# Patient Record
Sex: Male | Born: 1975 | Race: White | Hispanic: No | Smoking: Current every day smoker
Health system: Southern US, Community
[De-identification: ages and names within clinical notes are randomized; demographics above are authoritative.]

## PROBLEM LIST (undated history)

## (undated) HISTORY — PX: OTHER SURGICAL HISTORY: SHX169

---

## 2002-10-04 ENCOUNTER — Emergency Department (HOSPITAL_COMMUNITY): Admission: EM | Admit: 2002-10-04 | Discharge: 2002-10-04 | Payer: Self-pay | Admitting: Emergency Medicine

## 2005-05-27 ENCOUNTER — Emergency Department: Payer: Self-pay | Admitting: Emergency Medicine

## 2005-11-15 ENCOUNTER — Emergency Department: Payer: Self-pay | Admitting: Internal Medicine

## 2006-01-07 ENCOUNTER — Emergency Department: Payer: Self-pay | Admitting: Unknown Physician Specialty

## 2006-02-06 ENCOUNTER — Emergency Department: Payer: Self-pay | Admitting: Emergency Medicine

## 2006-03-04 ENCOUNTER — Emergency Department: Payer: Self-pay | Admitting: Internal Medicine

## 2006-04-05 ENCOUNTER — Emergency Department: Payer: Self-pay | Admitting: Emergency Medicine

## 2006-04-05 ENCOUNTER — Other Ambulatory Visit: Payer: Self-pay

## 2006-10-07 ENCOUNTER — Emergency Department: Payer: Self-pay | Admitting: Emergency Medicine

## 2006-12-25 ENCOUNTER — Emergency Department: Payer: Self-pay | Admitting: Emergency Medicine
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
## 2007-12-24 ENCOUNTER — Emergency Department: Payer: Self-pay | Admitting: Emergency Medicine

## 2008-10-01 ENCOUNTER — Emergency Department: Payer: Self-pay | Admitting: Emergency Medicine

## 2009-03-11 ENCOUNTER — Emergency Department: Payer: Self-pay | Admitting: Emergency Medicine

## 2009-04-21 ENCOUNTER — Emergency Department: Payer: Self-pay | Admitting: Emergency Medicine

## 2009-04-23 ENCOUNTER — Emergency Department: Payer: Self-pay | Admitting: Emergency Medicine

## 2010-08-23 ENCOUNTER — Emergency Department: Payer: Self-pay | Admitting: Emergency Medicine

## 2012-02-02 ENCOUNTER — Emergency Department: Payer: Self-pay | Admitting: *Deleted

## 2012-08-05 ENCOUNTER — Emergency Department: Payer: Self-pay | Admitting: Emergency Medicine

## 2012-11-09 ENCOUNTER — Emergency Department: Payer: Self-pay | Admitting: Internal Medicine

## 2013-01-11 ENCOUNTER — Emergency Department: Payer: Self-pay | Admitting: Emergency Medicine

## 2013-01-13 ENCOUNTER — Encounter (HOSPITAL_COMMUNITY): Payer: Self-pay | Admitting: *Deleted

## 2013-01-13 ENCOUNTER — Emergency Department (HOSPITAL_COMMUNITY): Payer: Self-pay

## 2013-01-13 ENCOUNTER — Emergency Department (HOSPITAL_COMMUNITY)
Admission: EM | Admit: 2013-01-13 | Discharge: 2013-01-13 | Disposition: A | Discharge status: Home / Self Care | Payer: Self-pay | Attending: Emergency Medicine | Admitting: Emergency Medicine

## 2013-01-13 DIAGNOSIS — S61239A Puncture wound without foreign body of unspecified finger without damage to nail, initial encounter: Secondary | ICD-10-CM

## 2013-01-13 DIAGNOSIS — W298XXA Contact with other powered powered hand tools and household machinery, initial encounter: Secondary | ICD-10-CM | POA: Insufficient documentation

## 2013-01-13 DIAGNOSIS — F17200 Nicotine dependence, unspecified, uncomplicated: Secondary | ICD-10-CM | POA: Insufficient documentation

## 2013-01-13 DIAGNOSIS — R209 Unspecified disturbances of skin sensation: Secondary | ICD-10-CM | POA: Insufficient documentation

## 2013-01-13 DIAGNOSIS — Y9389 Activity, other specified: Secondary | ICD-10-CM | POA: Insufficient documentation

## 2013-01-13 DIAGNOSIS — S61209A Unspecified open wound of unspecified finger without damage to nail, initial encounter: Secondary | ICD-10-CM | POA: Insufficient documentation

## 2013-01-13 DIAGNOSIS — Y929 Unspecified place or not applicable: Secondary | ICD-10-CM | POA: Insufficient documentation

## 2013-01-13 MED ORDER — OXYCODONE-ACETAMINOPHEN 5-325 MG PO TABS
1.0000 | ORAL_TABLET | Freq: Once | ORAL | Status: AC
  Administered 2013-01-13: 1 via ORAL
  Filled 2013-01-13: qty 1

## 2013-01-13 MED ORDER — HYDROCODONE-ACETAMINOPHEN 5-325 MG PO TABS: 1.0000 | ORAL_TABLET | Freq: Four times a day (QID) | ORAL | Status: DC | PRN

## 2013-01-13 MED ORDER — CEPHALEXIN 500 MG PO CAPS: 500.0000 mg | ORAL_CAPSULE | Freq: Two times a day (BID) | ORAL | Status: DC

## 2013-01-13 MED ORDER — CEPHALEXIN 250 MG PO CAPS
500.0000 mg | ORAL_CAPSULE | Freq: Once | ORAL | Status: AC
  Administered 2013-01-13: 500 mg via ORAL
  Filled 2013-01-13: qty 2

## 2013-01-13 NOTE — ED Provider Notes (Signed)
History    This chart was scribed for Justin Hicks, non-physician practitioner working with Flint Melter, MD by Leone Payor, ED Scribe. This patient was seen in room TR07C/TR07C and the patient's care was started at 1356.   CSN: [id number]  Arrival date & time 01/13/13  1356   None     Chief Complaint  Patient presents with  . Finger Injury     The history is provided by the patient. No language interpreter was used.    HPI Comments: Justin Hicks is a 37 y.o. male who presents to the Emergency Department complaining of a finger injury to the L pinkie finger that occurred yesterday. He reports accidentally drilled a drill bit through his finger. Pt states his tetanus is UTD. He has cleaned the affected area with peroxide. He denies any piece of the drill bit stuck inside the wound. He had associated numbness yesterday but is no longer numb today. States the bleeding is controlled today. Pt is a current everyday smoker and occasional alcohol user.   History reviewed. No pertinent past medical history.  Past Surgical History  Procedure Laterality Date  . Right leg surgery      No family history on file.  History  Substance Use Topics  . Smoking status: Current Every Day Smoker  . Smokeless tobacco: Not on file  . Alcohol Use: Yes     Comment: occ      Review of Systems  Constitutional: Negative for fever.  Skin: Positive for wound.  Neurological: Positive for numbness.    Allergies  Review of patient's allergies indicates no known allergies.  Home Medications  No current outpatient prescriptions on file.  BP 140/77  Pulse 80  Temp(Src) 98.4 F (36.9 C) (Oral)  Resp 16  SpO2 99%  Physical Exam  Nursing note and vitals reviewed. Constitutional: He is oriented to person, place, and time. He appears well-developed and well-nourished. No distress.  HENT:  Head: Normocephalic and atraumatic.  Eyes: EOM are normal.  Neck: Neck supple. No tracheal  deviation present.  Cardiovascular: Normal rate.   Pulmonary/Chest: Effort normal. No respiratory distress.  Musculoskeletal: Normal range of motion.  Through and through puncture wound to the distal left fifth finger. Does not involve the nail. Surrounding swelling and tenderness. Good ROM at both PIP DIP joint. Good strength with extension and flexion of the finger.   Neurological: He is alert and oriented to person, place, and time.  Skin: Skin is warm and dry.  Psychiatric: He has a normal mood and affect. His behavior is normal.    ED Course  Procedures (including critical care time)  DIAGNOSTIC STUDIES: Oxygen Saturation is 99% on room air, normal by my interpretation.    COORDINATION OF CARE: 3:22 PM Discussed treatment plan with pt at bedside and pt agreed to plan.   Labs Reviewed - No data to display Dg Hand Complete Left  01/13/2013  *RADIOLOGY REPORT*  Clinical Data: Traumatic injury to the distal fifth finger  LEFT HAND - COMPLETE 3+ VIEW  Comparison: None.  Findings: Soft tissue irregularity is noted consistent with the patient's given clinical history.  No acute fracture is noted.  No definitive bony injury is seen.  IMPRESSION: Soft tissue injury without definitive bony abnormality.   Original Report Authenticated By: Alcide Clever, M.D.      1. Puncture wound of finger of left hand, initial encounter       MDM  PT with through and through puncture wound to  the left distal phalanx of the 5th finger. No bony involvement. No nerve, vscular, tendon injury based on todays' exam. Tetanus up to date. Soaked in iodine and saline. Splint for protection. Ice elevation at  Home, NSAIDs, pain medication, keflex for infection prophylaxis. Follow up with hand.     I personally performed the services described in this documentation, which was scribed in my presence. The recorded information has been reviewed and is accurate.    Lottie Mussel, PA-C 01/13/13 1550

## 2013-01-13 NOTE — ED Notes (Signed)
Yesterday patient drilled a drill bit through his left pinkie.

## 2013-01-13 NOTE — Progress Notes (Signed)
Orthopedic Tech Progress Note Patient Details:  Justin Hicks [birth year] [id number]  Ortho Devices Type of Ortho Device: Finger splint Ortho Device/Splint Location: LUE Ortho Device/Splint Interventions: Ordered;Application   Jennye Moccasin 01/13/2013, 3:40 PM

## 2013-01-14 NOTE — ED Provider Notes (Signed)
Medical screening examination/treatment/procedure(s) were performed by non-physician practitioner and as supervising physician I was immediately available for consultation/collaboration.  Winnell Bento L Neo Yepiz, MD 01/14/13 0206 

## 2013-02-04 ENCOUNTER — Emergency Department (HOSPITAL_COMMUNITY)
Admission: EM | Admit: 2013-02-04 | Discharge: 2013-02-04 | Disposition: A | Discharge status: Home / Self Care | Payer: Self-pay | Attending: Emergency Medicine | Admitting: Emergency Medicine

## 2013-02-04 ENCOUNTER — Encounter (HOSPITAL_COMMUNITY): Payer: Self-pay

## 2013-02-04 DIAGNOSIS — M79609 Pain in unspecified limb: Secondary | ICD-10-CM | POA: Insufficient documentation

## 2013-02-04 DIAGNOSIS — M545 Low back pain, unspecified: Secondary | ICD-10-CM | POA: Insufficient documentation

## 2013-02-04 DIAGNOSIS — F17200 Nicotine dependence, unspecified, uncomplicated: Secondary | ICD-10-CM | POA: Insufficient documentation

## 2013-02-04 DIAGNOSIS — M542 Cervicalgia: Secondary | ICD-10-CM | POA: Insufficient documentation

## 2013-02-04 DIAGNOSIS — M549 Dorsalgia, unspecified: Secondary | ICD-10-CM

## 2013-02-04 MED ORDER — CYCLOBENZAPRINE HCL 10 MG PO TABS: 10.0000 mg | ORAL_TABLET | Freq: Two times a day (BID) | ORAL | Status: DC | PRN

## 2013-02-04 MED ORDER — OXYCODONE-ACETAMINOPHEN 5-325 MG PO TABS: 1.0000 | ORAL_TABLET | ORAL | Status: DC | PRN

## 2013-02-04 MED ORDER — CYCLOBENZAPRINE HCL 10 MG PO TABS
10.0000 mg | ORAL_TABLET | Freq: Once | ORAL | Status: AC
  Administered 2013-02-04: 10 mg via ORAL
  Filled 2013-02-04: qty 1

## 2013-02-04 MED ORDER — OXYCODONE-ACETAMINOPHEN 5-325 MG PO TABS
1.0000 | ORAL_TABLET | Freq: Once | ORAL | Status: AC
  Administered 2013-02-04: 1 via ORAL
  Filled 2013-02-04: qty 1

## 2013-02-04 NOTE — ED Notes (Signed)
Pt c/olower back pain x2 weeks, reports moving some furniture yesterday causing increase pain to the lower back and mid back

## 2013-02-04 NOTE — ED Provider Notes (Signed)
Medical screening examination/treatment/procedure(s) were performed by non-physician practitioner and as supervising physician I was immediately available for consultation/collaboration.  Elmus Mathes T Jarnell Cordaro, MD 02/04/13 2323 

## 2013-02-04 NOTE — Discharge Instructions (Signed)
Back Pain, Adult Low back pain is very common. About 1 in 5 people have back pain.The cause of low back pain is rarely dangerous. The pain often gets better over time.About half of people with a sudden onset of back pain feel better in just 2 weeks. About 8 in 10 people feel better by 6 weeks.  CAUSES Some common causes of back pain include:  Strain of the muscles or ligaments supporting the spine.  Wear and tear (degeneration) of the spinal discs.  Arthritis.  Direct injury to the back. DIAGNOSIS Most of the time, the direct cause of low back pain is not known.However, back pain can be treated effectively even when the exact cause of the pain is unknown.Answering your caregiver's questions about your overall health and symptoms is one of the most accurate ways to make sure the cause of your pain is not dangerous. If your caregiver needs more information, he or she may order lab work or imaging tests (X-rays or MRIs).However, even if imaging tests show changes in your back, this usually does not require surgery. HOME CARE INSTRUCTIONS For many people, back pain returns.Since low back pain is rarely dangerous, it is often a condition that people can learn to [facility] their own.   Remain active. It is stressful on the back to sit or stand in one place. Do not sit, drive, or stand in one place for more than 30 minutes at a time. Take short walks on level surfaces as soon as pain allows.Try to increase the length of time you walk each day.  Do not stay in bed.Resting more than 1 or 2 days can delay your recovery.  Do not avoid exercise or work.Your body is made to move.It is not dangerous to be active, even though your back may hurt.Your back will likely heal faster if you return to being active before your pain is gone.  Pay attention to your body when you bend and lift. Many people have less discomfortwhen lifting if they bend their knees, keep the load close to their bodies,and  avoid twisting. Often, the most comfortable positions are those that put less stress on your recovering back.  Find a comfortable position to sleep. Use a firm mattress and lie on your side with your knees slightly bent. If you lie on your back, put a pillow under your knees.  Only take over-the-counter or prescription medicines as directed by your caregiver. Over-the-counter medicines to reduce pain and inflammation are often the most helpful.Your caregiver may prescribe muscle relaxant drugs.These medicines help dull your pain so you can more quickly return to your normal activities and healthy exercise.  Put ice on the injured area.  Put ice in a plastic bag.  Place a towel between your skin and the bag.  Leave the ice on for 15-20 minutes, 3-4 times a day for the first 2 to 3 days. After that, ice and heat may be alternated to reduce pain and spasms.  Ask your caregiver about trying back exercises and gentle massage. This may be of some benefit.  Avoid feeling anxious or stressed.Stress increases muscle tension and can worsen back pain.It is important to recognize when you are anxious or stressed and learn ways to manage it.Exercise is a great option. SEEK MEDICAL CARE IF:  You have pain that is not relieved with rest or medicine.  You have pain that does not improve in 1 week.  You have new symptoms.  You are generally not feeling well. SEEK  IMMEDIATE MEDICAL CARE IF:   You have pain that radiates from your back into your legs.  You develop new bowel or bladder control problems.  You have unusual weakness or numbness in your arms or legs.  You develop nausea or vomiting.  You develop abdominal pain.  You feel faint. Document Released: 08/27/2005 Document Revised: 02/26/2012 Document Reviewed: 01/15/2011 [facility] Patient Information 2014 [place], [place].   Cryotherapy Cryotherapy means treatment with cold. Ice or gel packs can be used to reduce both pain and  swelling. Ice is the most helpful within the first 24 to 48 hours after an injury or flareup from overusing a muscle or joint. Sprains, strains, spasms, burning pain, shooting pain, and aches can all be eased with ice. Ice can also be used when recovering from surgery. Ice is effective, has very few side effects, and is safe for most people to use. PRECAUTIONS  Ice is not a safe treatment option for people with:  Raynaud's phenomenon. This is a condition affecting small blood vessels in the extremities. Exposure to cold may cause your problems to return.  Cold hypersensitivity. There are many forms of cold hypersensitivity, including:  Cold urticaria. Red, itchy hives appear on the skin when the tissues begin to warm after being iced.  Cold erythema. This is a red, itchy rash caused by exposure to cold.  Cold hemoglobinuria. Red blood cells break down when the tissues begin to warm after being iced. The hemoglobin that carry oxygen are passed into the urine because they cannot combine with blood proteins fast enough.  Numbness or altered sensitivity in the area being iced. If you have any of the following conditions, do not use ice until you have discussed cryotherapy with your caregiver:  Heart conditions, such as arrhythmia, angina, or chronic heart disease.  High blood pressure.  Healing wounds or open skin in the area being iced.  Current infections.  Rheumatoid arthritis.  Poor circulation.  Diabetes. Ice slows the blood flow in the region it is applied. This is beneficial when trying to stop inflamed tissues from spreading irritating chemicals to surrounding tissues. However, if you expose your skin to cold temperatures for too long or without the proper protection, you can damage your skin or nerves. Watch for signs of skin damage due to cold. HOME CARE INSTRUCTIONS Follow these tips to use ice and cold packs safely.  Place a dry or damp towel between the ice and skin. A damp  towel will cool the skin more quickly, so you may need to shorten the time that the ice is used.  For a more rapid response, add gentle compression to the ice.  Ice for no more than 10 to 20 minutes at a time. The bonier the area you are icing, the less time it will take to get the benefits of ice.  Check your skin after 5 minutes to make sure there are no signs of a poor response to cold or skin damage.  Rest 20 minutes or more in between uses.  Once your skin is numb, you can end your treatment. You can test numbness by very lightly touching your skin. The touch should be so light that you do not see the skin dimple from the pressure of your fingertip. When using ice, most people will feel these normal sensations in this order: cold, burning, aching, and numbness.  Do not use ice on someone who cannot communicate their responses to pain, such as small children or people with dementia.  HOW TO MAKE AN ICE PACK Ice packs are the most common way to use ice therapy. Other methods include ice massage, ice baths, and cryo-sprays. Muscle creams that cause a cold, tingly feeling do not offer the same benefits that ice offers and should not be used as a substitute unless recommended by your caregiver. To make an ice pack, do one of the following:  Place crushed ice or a bag of frozen vegetables in a sealable plastic bag. Squeeze out the excess air. Place this bag inside another plastic bag. Slide the bag into a pillowcase or place a damp towel between your skin and the bag.  Mix 3 parts water with 1 part rubbing alcohol. Freeze the mixture in a sealable plastic bag. When you remove the mixture from the freezer, it will be slushy. Squeeze out the excess air. Place this bag inside another plastic bag. Slide the bag into a pillowcase or place a damp towel between your skin and the bag. SEEK MEDICAL CARE IF:  You develop white spots on your skin. This may give the skin a blotchy (mottled)  appearance.  Your skin turns blue or pale.  Your skin becomes waxy or hard.  Your swelling gets worse. MAKE SURE YOU:   Understand these instructions.  Will watch your condition.  Will get help right away if you are not doing well or get worse. Document Released: 04/23/2011 Document Revised: 11/19/2011 Document Reviewed: 04/23/2011 [facility] Patient Information 2014 [place], [place].

## 2013-02-04 NOTE — ED Provider Notes (Signed)
History  This chart was scribed for Toy Baker, MD by Ardelia Mems, ED Scribe. This patient was seen in room TR05C/TR05C and the patient's care was started at 6:39 PM.   CSN: [id number]  Arrival date & time 02/04/13  1715     Chief Complaint  Patient presents with  . Back Pain    The history is provided by the patient. No language interpreter was used.    HPI Comments: Justin Hicks is a 37 y.o. male with a history of right leg surgery who presents to the Emergency Department complaining of moderate, intermittent lower back pain onset 2 weeks ago. Pt states that he was moving an entertainment center yesterday when his pain suddenly worsened. Pt states that pain radiates from lower back to center back, neck and legs. Pt denies head injury, abdominal pain, urinary symptoms, fever or any other symptoms. Pt is an alcohol user and a current every day smoker.   History reviewed. No pertinent past medical history.  Past Surgical History  Procedure Laterality Date  . Right leg surgery      History reviewed. No pertinent family history.  History  Substance Use Topics  . Smoking status: Current Every Day Smoker  . Smokeless tobacco: Not on file  . Alcohol Use: Yes     Comment: occ      Review of Systems  Gastrointestinal: Negative for abdominal pain.  Genitourinary: Negative for dysuria.  Musculoskeletal: Positive for back pain.    Allergies  Review of patient's allergies indicates no known allergies.  Home Medications   Current Outpatient Rx  Name  Route  Sig  Dispense  Refill  . HYDROcodone-acetaminophen (NORCO) 5-325 MG per tablet   Oral   Take 1 tablet by mouth every 6 (six) hours as needed for pain.   20 tablet   0     Triage Vitals: BP 140/70  Pulse 81  Temp(Src) 98.2 F (36.8 C) (Oral)  Resp 16  SpO2 98%  Physical Exam  Nursing note and vitals reviewed. Constitutional: He is oriented to person, place, and time. He appears well-developed and  well-nourished.  HENT:  Head: Normocephalic and atraumatic.  Eyes: EOM are normal. Pupils are equal, round, and reactive to light.  Neck: Normal range of motion. No tracheal deviation present.  Paracervical tenderness bilaterally with no swelling.  Pulmonary/Chest: Effort normal. No respiratory distress.  Abdominal: Soft. There is no tenderness.  Musculoskeletal:  Midline lower thoracic tenderness with minimal swelling.  Neurological: He is alert and oriented to person, place, and time.  Skin: Skin is warm. No rash noted.    ED Course  Procedures (including critical care time)  DIAGNOSTIC STUDIES: Oxygen Saturation is 98% on RA, normal by my interpretation.    COORDINATION OF CARE: 6:57 PM- Pt advised of plan for treatment and pt agrees.     Labs Reviewed - No data to display No results found.   No diagnosis found.  1. MUSCULAR BACK PAIN   MDM  Uncomplicated muscular back pain.         I personally performed the services described in this documentation, which was scribed in my presence. The recorded information has been reviewed and is accurate.     Arnoldo Hooker, PA-C 02/04/13 1915

## 2013-06-26 ENCOUNTER — Emergency Department (INDEPENDENT_AMBULATORY_CARE_PROVIDER_SITE_OTHER)
Admission: EM | Admit: 2013-06-26 | Discharge: 2013-06-26 | Disposition: A | Discharge status: Home / Self Care | Payer: Self-pay | Source: Home / Self Care | Attending: Family Medicine | Admitting: Family Medicine

## 2013-06-26 ENCOUNTER — Encounter (HOSPITAL_COMMUNITY): Payer: Self-pay | Admitting: Emergency Medicine

## 2013-06-26 DIAGNOSIS — K089 Disorder of teeth and supporting structures, unspecified: Secondary | ICD-10-CM

## 2013-06-26 DIAGNOSIS — K0889 Other specified disorders of teeth and supporting structures: Secondary | ICD-10-CM

## 2013-06-26 MED ORDER — AMOXICILLIN 500 MG PO CAPS: 500.0000 mg | ORAL_CAPSULE | Freq: Three times a day (TID) | ORAL | Status: DC

## 2013-06-26 MED ORDER — HYDROCODONE-ACETAMINOPHEN 5-325 MG PO TABS: 1.0000 | ORAL_TABLET | Freq: Four times a day (QID) | ORAL | Status: DC | PRN

## 2013-06-26 NOTE — ED Provider Notes (Signed)
Justin Hicks. is a 37 y.o. male who presents to Urgent Care today for left lower were tooth pain starting 3 days ago worsening. Pain is severe and interfering with sleep and eating. He is using the CPAP at her which is only helped a little. No fevers or chills nausea vomiting or diarrhea. He has an appointment with his dentist in 2 weeks.   History reviewed. No pertinent past medical history. History  Substance Use Topics  . Smoking status: Current Every Day Smoker  . Smokeless tobacco: Not on file  . Alcohol Use: Yes     Comment: occ   ROS as above Medications reviewed. No current facility-administered medications for this encounter.   Current Outpatient Prescriptions  Medication Sig Dispense Refill  . amoxicillin (AMOXIL) 500 MG capsule Take 1 capsule (500 mg total) by mouth 3 (three) times daily.  40 capsule  0  . HYDROcodone-acetaminophen (NORCO/VICODIN) 5-325 MG per tablet Take 1 tablet by mouth every 6 (six) hours as needed for pain.  20 tablet  0    Exam:  BP 130/84  Pulse 76  Temp(Src) 98.5 F (36.9 C) (Oral)  Resp 18  SpO2 100% Gen: Well NAD HEENT: EOMI,  MMM, broken degenerative left lower molar with exposed pulp. No surrounding gumline erythema. Tender to touch Lungs: CTABL Nl WOB Heart: RRR no MRG   Left tooth injecton Consent obtained Numbing topical paste applied to the base of the rear most left lower molar. 1 mL of Marcaine and epinephrine injection injected into the base of the tooth.  Patient had significant improvement in pain following the injection.  Assessment and Plan: 37 y.o. male with tooth pain status post injection.  Plan to treat with amoxicillin and Norco for pain control Referred to for trouble dentures Discussed warning signs or symptoms. Please see discharge instructions. Patient expresses understanding.      Rodolph Bong, MD 06/26/13 [phone]

## 2013-06-26 NOTE — ED Notes (Signed)
C/o tooth pain; has an appointment to see dental provider in [facility] in 2 weeks; cannot sleep due to pain

## 2013-06-29 ENCOUNTER — Encounter (HOSPITAL_COMMUNITY): Payer: Self-pay | Admitting: Emergency Medicine

## 2013-06-29 ENCOUNTER — Emergency Department (INDEPENDENT_AMBULATORY_CARE_PROVIDER_SITE_OTHER)
Admission: EM | Admit: 2013-06-29 | Discharge: 2013-06-29 | Disposition: A | Discharge status: Home / Self Care | Payer: Self-pay | Source: Home / Self Care

## 2013-06-29 DIAGNOSIS — K029 Dental caries, unspecified: Secondary | ICD-10-CM

## 2013-06-29 MED ORDER — KETOROLAC TROMETHAMINE 10 MG PO TABS: 10.0000 mg | ORAL_TABLET | Freq: Four times a day (QID) | ORAL | Status: DC | PRN

## 2013-06-29 NOTE — Discharge Instructions (Signed)
Take medicine as prescribed, see your dentist as soon as possible °

## 2013-06-29 NOTE — ED Provider Notes (Signed)
CSN: [id number]     Arrival date & time 06/29/13  1516 History   None    Chief Complaint  Patient presents with  . Dental Pain   (Consider location/radiation/quality/duration/timing/severity/associated sxs/prior Treatment) Patient is a 37 y.o. male presenting with tooth pain. The history is provided by the patient.  Dental Pain Location:  Lower Lower teeth location:  17/LL 3rd molar Quality:  Throbbing Severity:  Moderate Onset quality:  Gradual Duration:  3 days Timing:  Constant Progression:  Worsening Chronicity:  New Context: abscess, dental caries and poor dentition   Associated symptoms: no facial pain, no facial swelling and no fever     History reviewed. No pertinent past medical history. Past Surgical History  Procedure Laterality Date  . Right leg surgery     History reviewed. No pertinent family history. History  Substance Use Topics  . Smoking status: Current Every Day Smoker  . Smokeless tobacco: Not on file  . Alcohol Use: Yes     Comment: occ    Review of Systems  Constitutional: Negative.  Negative for fever.  HENT: Positive for dental problem. Negative for facial swelling.     Allergies  Review of patient's allergies indicates no known allergies.  Home Medications   Current Outpatient Rx  Name  Route  Sig  Dispense  Refill  . amoxicillin (AMOXIL) 500 MG capsule   Oral   Take 1 capsule (500 mg total) by mouth 3 (three) times daily.   40 capsule   0   . HYDROcodone-acetaminophen (NORCO/VICODIN) 5-325 MG per tablet   Oral   Take 1 tablet by mouth every 6 (six) hours as needed for pain.   20 tablet   0   . ketorolac (TORADOL) 10 MG tablet   Oral   Take 1 tablet (10 mg total) by mouth every 6 (six) hours as needed for pain.   20 tablet   0    BP 124/79  Pulse 83  Temp(Src) 98.1 F (36.7 C) (Oral)  Resp 16  SpO2 99% Physical Exam  Nursing note and vitals reviewed. Constitutional: He appears well-developed and well-nourished. No  distress.  HENT:  Head: Normocephalic.  Right Ear: External ear normal.  Left Ear: External ear normal.  Mouth/Throat: Oropharynx is clear and moist. Dental abscesses and dental caries present.      ED Course  Procedures (including critical care time) Labs Review Labs Reviewed - No data to display Imaging Review No results found.    MDM      Linna Hoff, MD 06/29/13 1739
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
## 2013-06-29 NOTE — ED Notes (Signed)
States he has an appt to see dental provider in [place] in 2 weeks

## 2014-01-27 ENCOUNTER — Emergency Department: Payer: Self-pay | Admitting: Emergency Medicine

## 2014-04-20 IMAGING — CR DG HAND COMPLETE 3+V*L*
3 series · 3 of 3 positions shown · non-contrast
Comparison: None.

CLINICAL DATA: Traumatic injury to the distal fifth finger

LEFT HAND - COMPLETE 3+ VIEW

[x hand pa left]
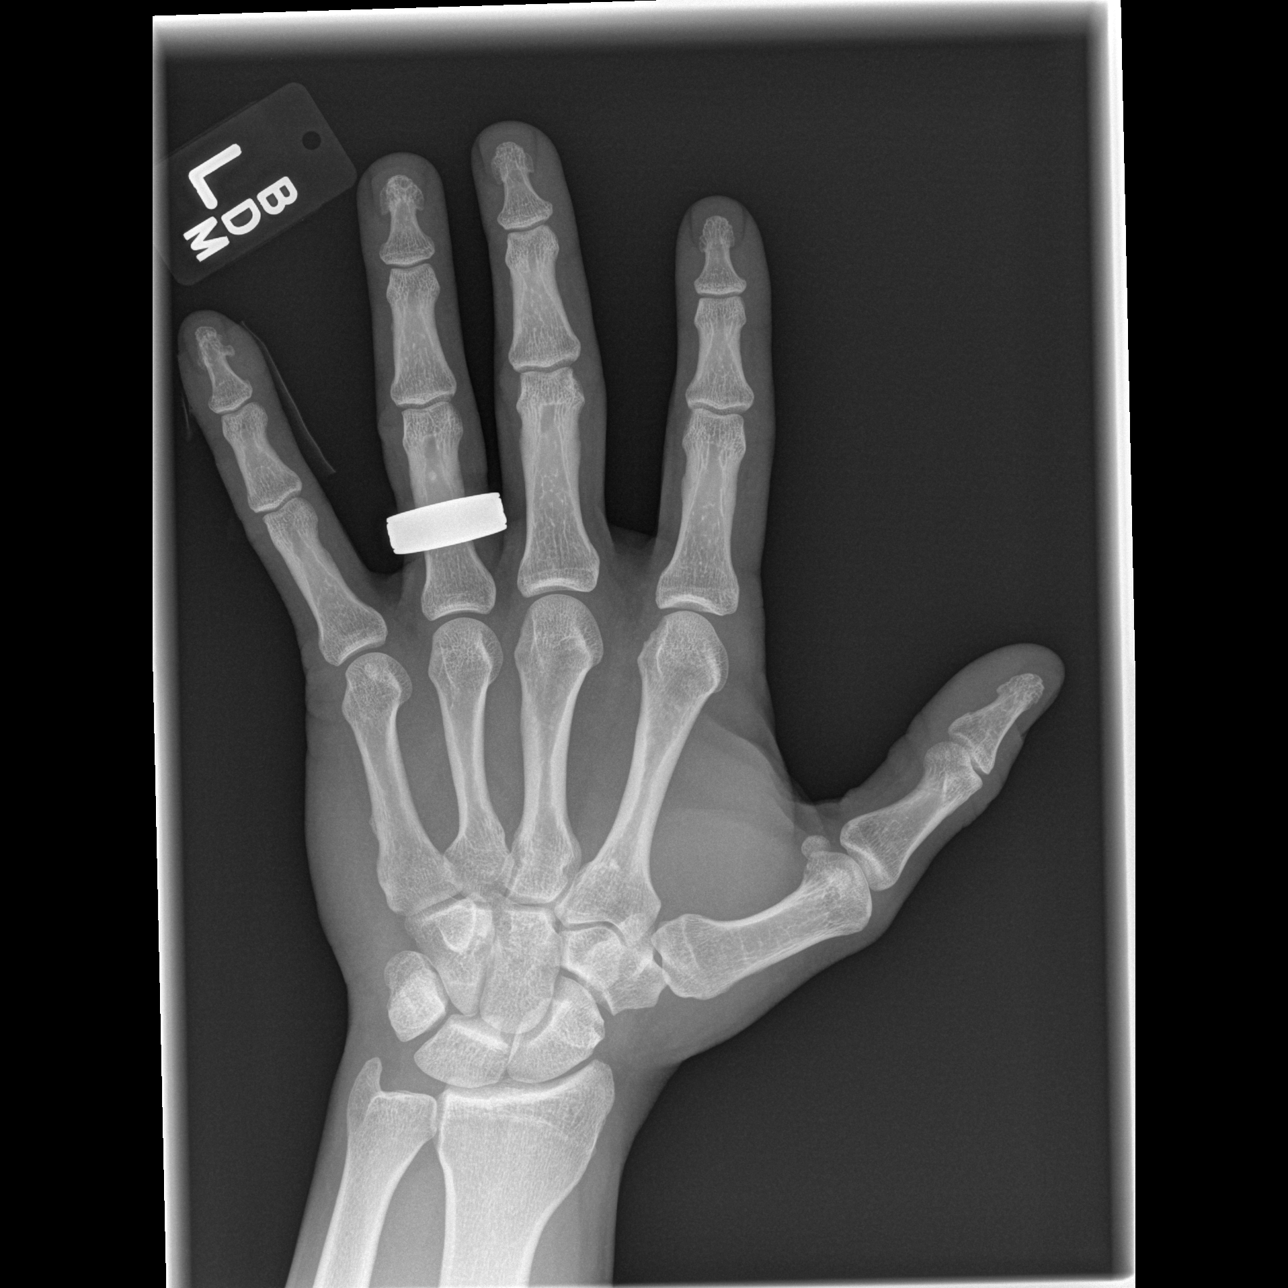

[x hand oblique left]
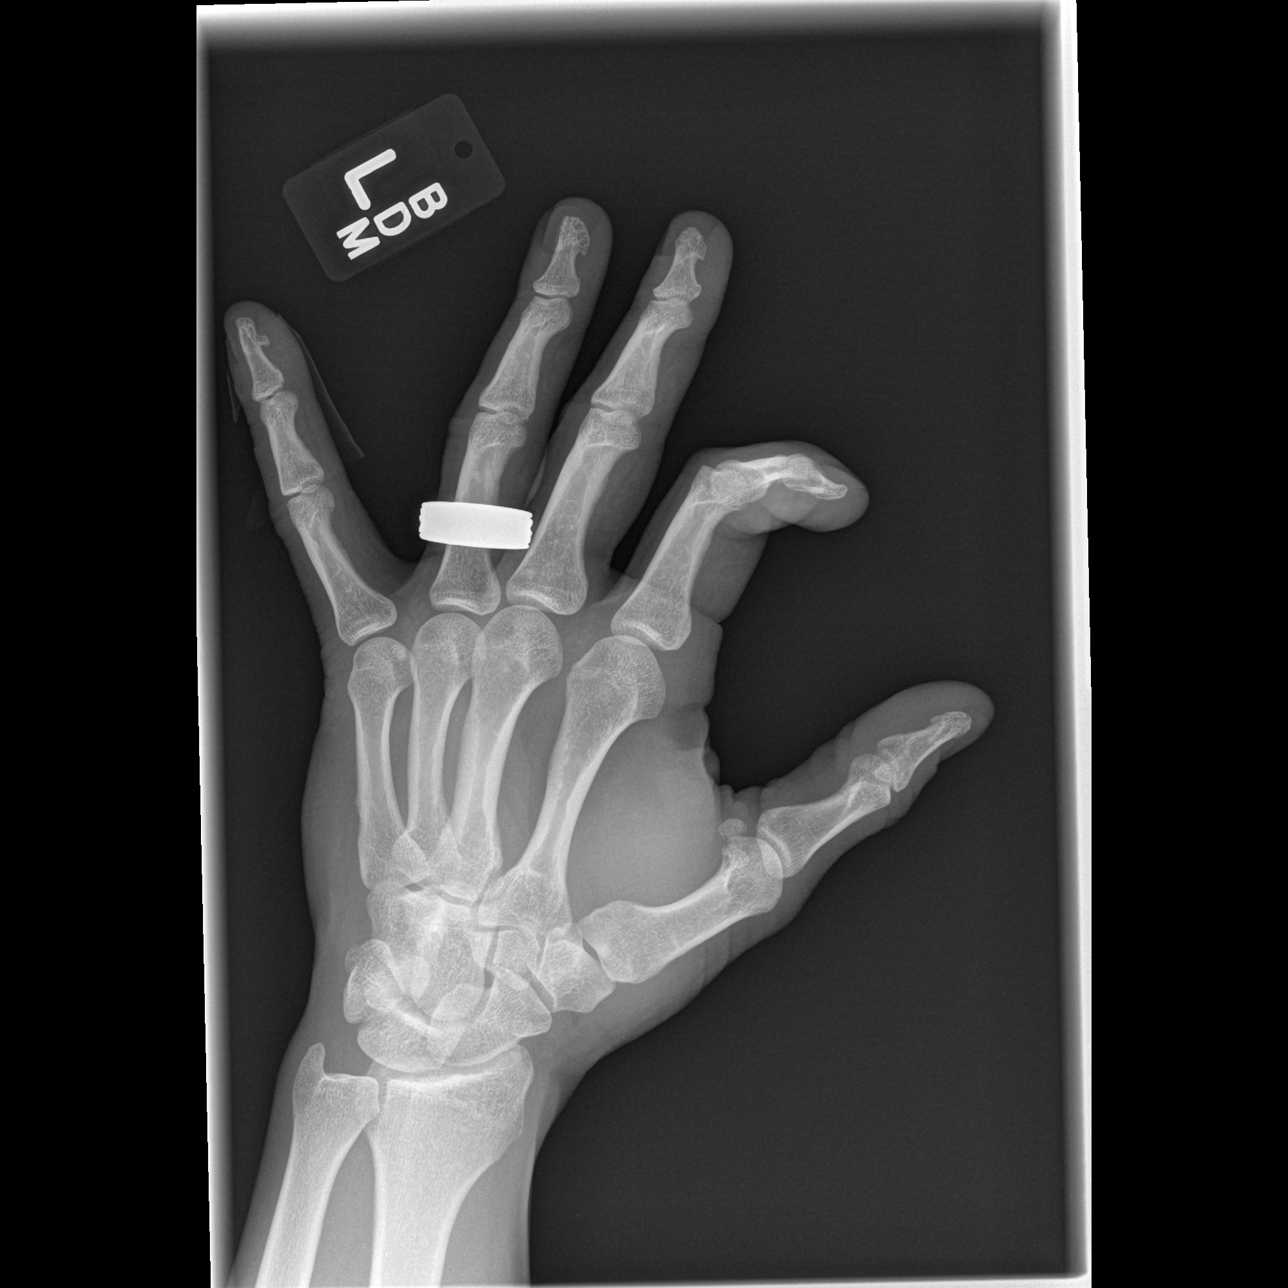

[x hand lat left]
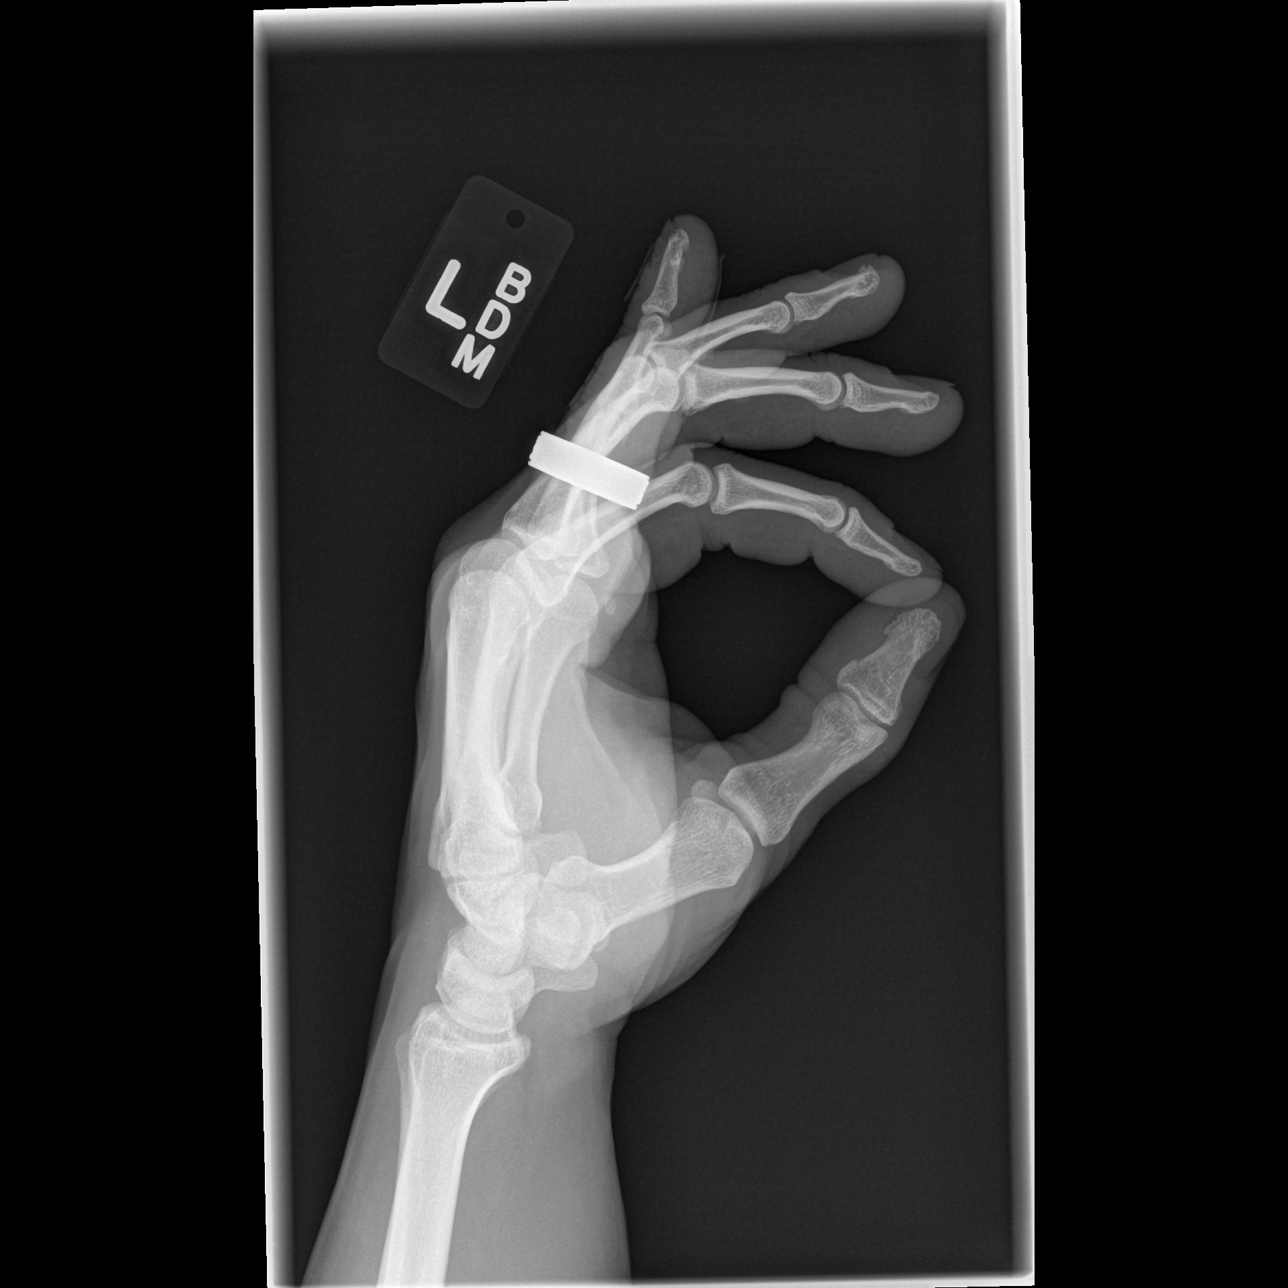

[3 of 3 positions shown; findings below may reference images not displayed]

FINDINGS: Soft tissue irregularity is noted consistent with the
patient's given clinical history.  No acute fracture is noted.  No
definitive bony injury is seen.
IMPRESSION: Soft tissue injury without definitive bony abnormality.

## 2014-07-09 ENCOUNTER — Encounter (HOSPITAL_COMMUNITY): Payer: Self-pay | Admitting: Emergency Medicine

## 2014-07-09 ENCOUNTER — Emergency Department (HOSPITAL_COMMUNITY)
Admission: EM | Admit: 2014-07-09 | Discharge: 2014-07-09 | Disposition: A | Discharge status: Home / Self Care | Payer: Self-pay | Attending: Emergency Medicine | Admitting: Emergency Medicine

## 2014-07-09 DIAGNOSIS — K0889 Other specified disorders of teeth and supporting structures: Secondary | ICD-10-CM

## 2014-07-09 DIAGNOSIS — K029 Dental caries, unspecified: Secondary | ICD-10-CM | POA: Insufficient documentation

## 2014-07-09 DIAGNOSIS — K088 Other specified disorders of teeth and supporting structures: Secondary | ICD-10-CM | POA: Insufficient documentation

## 2014-07-09 DIAGNOSIS — Z720 Tobacco use: Secondary | ICD-10-CM | POA: Insufficient documentation

## 2014-07-09 MED ORDER — IBUPROFEN 800 MG PO TABS: 800.0000 mg | ORAL_TABLET | Freq: Three times a day (TID) | ORAL | Status: DC | PRN

## 2014-07-09 MED ORDER — HYDROCODONE-ACETAMINOPHEN 5-325 MG PO TABS
1.0000 | ORAL_TABLET | ORAL | Status: DC | PRN
Start: 2014-07-09 — End: 2014-08-22

## 2014-07-09 MED ORDER — HYDROCODONE-ACETAMINOPHEN 5-325 MG PO TABS
1.0000 | ORAL_TABLET | Freq: Once | ORAL | Status: AC
  Administered 2014-07-09: 1 via ORAL
  Filled 2014-07-09: qty 1

## 2014-07-09 MED ORDER — ONDANSETRON HCL 4 MG PO TABS: 4.0000 mg | ORAL_TABLET | Freq: Three times a day (TID) | ORAL | Status: DC | PRN

## 2014-07-09 NOTE — ED Notes (Signed)
Per pt sts dental pain for weeks. sts he was seen by a dentist 2 days ago and was given abx. sts the abx make him sick. sts he has an apt in a few weeks to see an oral [employer].

## 2014-07-09 NOTE — Discharge Instructions (Signed)
Read the information below.  Use the prescribed medication as directed.  Please discuss all new medications with your pharmacist.  Do not take additional tylenol while taking the prescribed pain medication to avoid overdose.  You may return to the Emergency Department at any time for worsening condition or any new symptoms that concern you.  Please call your dentist or oral surgeon for further medications for pain control while you await your procedure. If you develop fevers, swelling in your face, difficulty swallowing or breathing, return to the ER immediately for a recheck.    Dental Pain A tooth ache may be caused by cavities (tooth decay). Cavities expose the nerve of the tooth to air and hot or cold temperatures. It may come from an infection or abscess (also called a boil or furuncle) around your tooth. It is also often caused by dental caries (tooth decay). This causes the pain you are having. DIAGNOSIS  Your caregiver can diagnose this problem by exam. TREATMENT   If caused by an infection, it may be treated with medications which kill germs (antibiotics) and pain medications as prescribed by your caregiver. Take medications as directed.  Only take over-the-counter or prescription medicines for pain, discomfort, or fever as directed by your caregiver.  Whether the tooth ache today is caused by infection or dental disease, you should see your dentist as soon as possible for further care. SEEK MEDICAL CARE IF: The exam and treatment you received today has been provided on an emergency basis only. This is not a substitute for complete medical or dental care. If your problem worsens or new problems (symptoms) appear, and you are unable to meet with your dentist, call or return to this location. SEEK IMMEDIATE MEDICAL CARE IF:   You have a fever.  You develop redness and swelling of your face, jaw, or neck.  You are unable to open your mouth.  You have severe pain uncontrolled by pain  medicine. MAKE SURE YOU:   Understand these instructions.  Will watch your condition.  Will get help right away if you are not doing well or get worse. Document Released: 08/27/2005 Document Revised: 11/19/2011 Document Reviewed: 04/14/2008 [facility] Patient Information 2015 [place], [place]. This information is not intended to replace advice given to you by your health care provider. Make sure you discuss any questions you have with your health care provider.  Dental Caries Dental caries (also called tooth decay) is the most common oral disease. It can occur at any age but is more common in children and young adults.  HOW DENTAL CARIES DEVELOPS  The process of decay begins when bacteria and foods (particularly sugars and starches) combine in your mouth to produce plaque. Plaque is a substance that sticks to the hard, outer surface of a tooth (enamel). The bacteria in plaque produce acids that attack enamel. These acids may also attack the root surface of a tooth (cementum) if it is exposed. Repeated attacks dissolve these surfaces and create holes in the tooth (cavities). If left untreated, the acids destroy the other layers of the tooth.  RISK FACTORS  Frequent sipping of sugary beverages.   Frequent snacking on sugary and starchy foods, especially those that easily get stuck in the teeth.   Poor oral hygiene.   Dry mouth.   Substance abuse such as methamphetamine abuse.   Broken or poor-fitting dental restorations.   Eating disorders.   Gastroesophageal reflux disease (GERD).   Certain radiation treatments to the head and neck. SYMPTOMS In  the early stages of dental caries, symptoms are seldom present. Sometimes white, chalky areas may be seen on the enamel or other tooth layers. In later stages, symptoms may include:  Pits and holes on the enamel.  Toothache after sweet, hot, or cold foods or drinks are consumed.  Pain around the tooth.  Swelling around the  tooth. DIAGNOSIS  Most of the time, dental caries is detected during a regular dental checkup. A diagnosis is made after a thorough medical and dental history is taken and the surfaces of your teeth are checked for signs of dental caries. Sometimes special instruments, such as lasers, are used to check for dental caries. Dental X-ray exams may be taken so that areas not visible to the eye (such as between the contact areas of the teeth) can be checked for cavities.  TREATMENT  If dental caries is in its early stages, it may be reversed with a fluoride treatment or an application of a remineralizing agent at the dental office. Thorough brushing and flossing at home is needed to aid these treatments. If it is in its later stages, treatment depends on the location and extent of tooth destruction:   If a small area of the tooth has been destroyed, the destroyed area will be removed and cavities will be filled with a material such as gold, silver amalgam, or composite resin.   If a large area of the tooth has been destroyed, the destroyed area will be removed and a cap (crown) will be fitted over the remaining tooth structure.   If the center part of the tooth (pulp) is affected, a procedure called a root canal will be needed before a filling or crown can be placed.   If most of the tooth has been destroyed, the tooth may need to be pulled (extracted). HOME CARE INSTRUCTIONS You can prevent, stop, or reverse dental caries at home by practicing good oral hygiene. Good oral hygiene includes:  Thoroughly cleaning your teeth at least twice a day with a toothbrush and dental floss.   Using a fluoride toothpaste. A fluoride mouth rinse may also be used if recommended by your dentist or health care provider.   Restricting the amount of sugary and starchy foods and sugary liquids you consume.   Avoiding frequent snacking on these foods and sipping of these liquids.   Keeping regular visits with a  dentist for checkups and cleanings. PREVENTION   Practice good oral hygiene.  Consider a dental sealant. A dental sealant is a coating material that is applied by your dentist to the pits and grooves of teeth. The sealant prevents food from being trapped in them. It may protect the teeth for several years.  Ask about fluoride supplements if you live in a community without fluorinated water or with water that has a low fluoride content. Use fluoride supplements as directed by your dentist or health care provider.  Allow fluoride varnish applications to teeth if directed by your dentist or health care provider. Document Released: 05/19/2002 Document Revised: 01/11/2014 Document Reviewed: 08/29/2012 [facility] Patient Information 2015 [place] MooreExitCare, [place]LLC. This information is not intended to replace advice given to you by your health care provider. Make sure you discuss any questions you have with your health care provider.

## 2014-07-09 NOTE — ED Provider Notes (Signed)
CSN: [id number]     Arrival date & time 07/09/14  1620 History  This chart was scribed for [facility] MonroeEmily Dasean Brow, GeorgiaPA, working with Raelyn NumberKristen N Ward, DO found by Elon SpannerGarrett Cook, ED Scribe. This patient was seen in room TR05C/TR05C and the patient's care was started at 5:23 PM.  Chief Complaint  Patient presents with  . Dental Pain   The history is provided by the patient. No language interpreter was used.   HPI Comments: Alinda DoomsJames Marshall Pupo Jr. is a 38 y.o. male who presents to the Emergency Department complaining of intermittent, worsening left upper dental pain rated 10/10 with associated swelling onset one month ago.  Patient reports he was seen by a dentist two days ago, prescribed antibiotics, and referred to oral surgeon for an extraction.  He has an appointment scheduled in two weeks.  He reports worsening of the dental pain since he was seen at the dentist two days ago.  He also reports he had several episodes of emesis two nights ago and states he may have taken the antibiotics without food that night.  Patient reports he has taken aleve, BC powder, and ASA without no relief.   Patient denies fever, sore throat, difficulty swallowing or breathing.   History reviewed. No pertinent past medical history. Past Surgical History  Procedure Laterality Date  . Right leg surgery     History reviewed. No pertinent family history. History  Substance Use Topics  . Smoking status: Current Every Day Smoker  . Smokeless tobacco: Not on file  . Alcohol Use: Yes     Comment: occ    Review of Systems  Constitutional: Negative for fever.  HENT: Positive for dental problem. Negative for facial swelling and sore throat.   Respiratory: Negative for shortness of breath.   Musculoskeletal: Negative for neck pain.  Skin: Negative for color change.  Allergic/Immunologic: Negative for immunocompromised state.  Hematological: Does not bruise/bleed easily.      Allergies  Review of patient's allergies  indicates no known allergies.  Home Medications   Prior to Admission medications   Medication Sig Start Date End Date Taking? Authorizing Provider  penicillin v potassium (VEETID) 500 MG tablet Take 500 mg by mouth every 6 (six) hours.   Yes Historical Provider, MD   BP 122/77  Pulse 85  Temp(Src) 98.4 F (36.9 C) (Oral)  Resp 16  SpO2 98% Physical Exam  Nursing note and vitals reviewed. HENT:  Mouth/Throat: Oropharynx is clear and moist. No oropharyngeal exudate.  Left upper second molar with severe decay.  Tender to percussion.  No facial swelling.  No anterior cervical adenopathy or paratracheal tenderness.   Eyes: Conjunctivae are normal.  Neck: Neck supple.  Cardiovascular: Normal rate and regular rhythm.   Pulmonary/Chest: Effort normal and breath sounds normal. No stridor. No respiratory distress. He has no wheezes. He has no rales. He exhibits no tenderness.  No stridor.     ED Course  Procedures (including critical care time)  DIAGNOSTIC STUDIES: Oxygen Saturation is 98% on RA, normal by my interpretation.    COORDINATION OF CARE:  5:37 PM Will order pain medication.  Patient acknowledges and agrees with plan.    Labs Review Labs Reviewed - No data to display  Imaging Review No results found.   EKG Interpretation None      MDM   Final diagnoses:  Pain, dental  Dental decay   Afebrile, nontoxic patient with new dental pain.  No obvious abscess.  Pt on penicillin from  dentist and has appt with oral surgeon in 2 weeks. No concerning findings on exam.  Doubt deep space head or neck infection.  Doubt Ludwig's angina.  D/C home with antibiotic, pain medication and dental follow up.  Discussed findings, treatment, and follow up  with patient.  Pt given return precautions.  Pt verbalizes understanding and agrees with plan.       I personally performed the services described in this documentation, which was scribed in my presence. The recorded information has  been reviewed and is accurate.    Trixie DredgeEmily Christyann Manolis, PA-C 07/09/14 1745

## 2014-07-09 NOTE — ED Provider Notes (Signed)
Medical screening examination/treatment/procedure(s) were performed by non-physician practitioner and as supervising physician I was immediately available for consultation/collaboration.   EKG Interpretation None        Layla MawKristen N Ward, DO 07/09/14 2327

## 2014-08-22 ENCOUNTER — Encounter (HOSPITAL_COMMUNITY): Payer: Self-pay | Admitting: *Deleted

## 2014-08-22 ENCOUNTER — Emergency Department (HOSPITAL_COMMUNITY)
Admission: EM | Admit: 2014-08-22 | Discharge: 2014-08-22 | Disposition: A | Discharge status: Home / Self Care | Payer: Self-pay | Attending: Emergency Medicine | Admitting: Emergency Medicine

## 2014-08-22 DIAGNOSIS — Z792 Long term (current) use of antibiotics: Secondary | ICD-10-CM | POA: Insufficient documentation

## 2014-08-22 DIAGNOSIS — K088 Other specified disorders of teeth and supporting structures: Secondary | ICD-10-CM | POA: Insufficient documentation

## 2014-08-22 DIAGNOSIS — K0889 Other specified disorders of teeth and supporting structures: Secondary | ICD-10-CM

## 2014-08-22 DIAGNOSIS — Z720 Tobacco use: Secondary | ICD-10-CM | POA: Insufficient documentation

## 2014-08-22 MED ORDER — OXYCODONE-ACETAMINOPHEN 5-325 MG PO TABS
1.0000 | ORAL_TABLET | Freq: Once | ORAL | Status: AC
  Administered 2014-08-22: 1 via ORAL
  Filled 2014-08-22: qty 1

## 2014-08-22 MED ORDER — HYDROCODONE-ACETAMINOPHEN 5-325 MG PO TABS: 1.0000 | ORAL_TABLET | Freq: Four times a day (QID) | ORAL | Status: DC | PRN

## 2014-08-22 NOTE — ED Notes (Signed)
Pt states that he has a broken tooth on the upper left side. Pt states that he was told that he needs to see a surgeon to have it pull but does not have insurance and cant be seen till the first of the year.

## 2014-08-22 NOTE — ED Notes (Signed)
Pt declined wheelchair.  Escorted to discharge desk by RN

## 2014-08-22 NOTE — Discharge Instructions (Signed)
Return to the emergency room with worsening of symptoms, new symptoms or with symptoms that are concerning, especially unable to open her mouth, difficulty breathing, swelling of her lips, tongue, face, fevers. Interim you to take her antibiotics as prescribed. Norco for severe pain. Do not operate machinery, drive or drink alcohol while taking narcotics or muscle relaxers. Follow up with your oral surgeon as scheduled next month.   Dental Pain A tooth ache may be caused by cavities (tooth decay). Cavities expose the nerve of the tooth to air and hot or cold temperatures. It may come from an infection or abscess (also called a boil or furuncle) around your tooth. It is also often caused by dental caries (tooth decay). This causes the pain you are having. DIAGNOSIS  Your caregiver can diagnose this problem by exam. TREATMENT   If caused by an infection, it may be treated with medications which kill germs (antibiotics) and pain medications as prescribed by your caregiver. Take medications as directed.  Only take over-the-counter or prescription medicines for pain, discomfort, or fever as directed by your caregiver.  Whether the tooth ache today is caused by infection or dental disease, you should see your dentist as soon as possible for further care. SEEK MEDICAL CARE IF: The exam and treatment you received today has been provided on an emergency basis only. This is not a substitute for complete medical or dental care. If your problem worsens or new problems (symptoms) appear, and you are unable to meet with your dentist, call or return to this location. SEEK IMMEDIATE MEDICAL CARE IF:   You have a fever.  You develop redness and swelling of your face, jaw, or neck.  You are unable to open your mouth.  You have severe pain uncontrolled by pain medicine. MAKE SURE YOU:   Understand these instructions.  Will watch your condition.  Will get help right away if you are not doing well or  get worse. Document Released: 08/27/2005 Document Revised: 11/19/2011 Document Reviewed: 04/14/2008 [facility] Patient Information 2015 [place], [place]LL[place]. This information is not intended to replace advice given to you by your health care provider. Make sure you discuss any questions you have with your health care provider.

## 2014-08-22 NOTE — ED Provider Notes (Signed)
CSN: [id number]     Arrival date & time 08/22/14  1350 History  This chart was scribed for Oswaldo ConroyVictoria Jerrell Mangel, PA-C, working with Derwood KaplanAnkit Nanavati, MD by Chestine SporeSoijett Blue, ED Scribe. The patient was seen in room TR06C/TR06C at 3:27 PM.    Chief Complaint  Patient presents with  . Dental Pain    The history is provided by the patient. No language interpreter was used.   HPI Comments: Justin DoomsJames Marshall Mcreynolds Jr. is a 38 y.o. male who presents to the Emergency Department complaining of upper left dental pain. He reports that he was seen at [facility] and he was going to have it pulled. He couldn't have the tooth pulled because of the way that the root was situated. He reports that he has fixed his insurance issues and he now has an appointment for early January with an oral surgeron. he has broken the tooth twice since it was first looked at. He reports that he was informed that he needed to see a surgeon to have the tooth pulled but since he does not have insurance he has to wait until the first of the year. He was Rx Penicillin at the dentist appointment and he is still taking them. He has enough abx until the procedure. He reports that last night he only got two hours of sleep and that the pain is affecting his eating as well.  He states that he has tried tylenol, BC Goody powder, ibuprofen with no relief for his symptoms. He denies fever, chills, difficulty opening, facial swelling, difficulty swallowing, and any other symptoms. Denies hx of DM and any other health problems. Denies having a PCP.    History reviewed. No pertinent past medical history. Past Surgical History  Procedure Laterality Date  . Right leg surgery     No family history on file. History  Substance Use Topics  . Smoking status: Current Every Day Smoker -- 0.00 packs/day  . Smokeless tobacco: Not on file  . Alcohol Use: Yes     Comment: occ    Review of Systems  Constitutional: Positive for appetite change. Negative for  fever and chills.  HENT: Positive for dental problem. Negative for facial swelling and trouble swallowing.   Respiratory: Negative for shortness of breath and stridor.   Cardiovascular: Negative for chest pain.  Musculoskeletal: Negative for neck pain.  Skin: Negative for rash.      Allergies  Review of patient's allergies indicates no known allergies.  Home Medications   Prior to Admission medications   Medication Sig Start Date End Date Taking? Authorizing Provider  HYDROcodone-acetaminophen (NORCO/VICODIN) 5-325 MG per tablet Take 1 tablet by mouth every 6 (six) hours as needed for moderate pain or severe pain. 08/22/14   Louann SjogrenVictoria L Kizzie Cotten, PA-C  ibuprofen (ADVIL,MOTRIN) 800 MG tablet Take 1 tablet (800 mg total) by mouth every 8 (eight) hours as needed for mild pain or moderate pain. 07/09/14   Trixie DredgeEmily West, PA-C  ondansetron (ZOFRAN) 4 MG tablet Take 1 tablet (4 mg total) by mouth every 8 (eight) hours as needed for nausea or vomiting. 07/09/14   Trixie DredgeEmily West, PA-C  penicillin v potassium (VEETID) 500 MG tablet Take 500 mg by mouth every 6 (six) hours.    Historical Provider, MD   BP 134/75 mmHg  Pulse 75  Temp(Src) 98.2 F (36.8 C) (Oral)  Resp 18  SpO2 99%  Physical Exam  Constitutional: He appears well-developed and well-nourished. No distress.  HENT:  Head: Normocephalic and atraumatic.  Mouth/Throat: Uvula is midline. No trismus in the jaw.  No uvula deviation. No lip, face, or oral swelling. No swelling of tongue or under the tongue and no tenderness of the tongue.   Eyes: Conjunctivae and EOM are normal. Right eye exhibits no discharge. Left eye exhibits no discharge.  Neck: Normal range of motion. Neck supple.  No masses, or swelling.   Cardiovascular: Normal rate, regular rhythm and normal heart sounds.   Pulmonary/Chest: Effort normal and breath sounds normal. No respiratory distress. He has no wheezes.  Abdominal: Soft. Bowel sounds are normal. He exhibits no  distension. There is no tenderness.  Lymphadenopathy:    He has no cervical adenopathy.  Neurological: He is alert. He exhibits normal muscle tone. Coordination normal.  Skin: Skin is warm and dry. He is not diaphoretic.  Nursing note and vitals reviewed.   ED Course  Procedures (including critical care time) DIAGNOSTIC STUDIES: Oxygen Saturation is 99% on room air, normal by my interpretation.    COORDINATION OF CARE: 3:34 PM-Discussed treatment plan which includes percocet Rx with pt at bedside and pt agreed to plan.   Labs Review Labs Reviewed - No data to display  Imaging Review No results found.   EKG Interpretation None        MDM   Final diagnoses:  Pain, dental   Patient with toothache.  No gross abscess.  No history of diabetes. Exam unconcerning for Ludwig's angina or spread of infection.  Will treat with penicillin and pain medicine.  Driving and sedation precautions provided. Patient to follow up with oral surgeon as scheduled.  ED resources provided.  Discussed return precautions with patient. Discussed all results and patient verbalizes understanding and agrees with plan.  I personally performed the services described in this documentation, which was scribed in my presence. The recorded information has been reviewed and is accurate.    Louann SjogrenVictoria L Nicie Milan, PA-C 08/22/14 1646  Derwood KaplanAnkit Nanavati, MD 08/22/14 [phone]

## 2014-11-04 ENCOUNTER — Encounter (HOSPITAL_COMMUNITY): Payer: Self-pay | Admitting: Family Medicine

## 2014-11-04 ENCOUNTER — Emergency Department (INDEPENDENT_AMBULATORY_CARE_PROVIDER_SITE_OTHER)
Admission: EM | Admit: 2014-11-04 | Discharge: 2014-11-04 | Disposition: A | Discharge status: Home / Self Care | Payer: BLUE CROSS/BLUE SHIELD | Source: Home / Self Care | Attending: Family Medicine | Admitting: Family Medicine

## 2014-11-04 DIAGNOSIS — K047 Periapical abscess without sinus: Secondary | ICD-10-CM

## 2014-11-04 MED ORDER — HYDROCODONE-ACETAMINOPHEN 5-325 MG PO TABS: 1.0000 | ORAL_TABLET | Freq: Four times a day (QID) | ORAL | Status: DC | PRN

## 2014-11-04 MED ORDER — CLINDAMYCIN HCL 300 MG PO CAPS: 300.0000 mg | ORAL_CAPSULE | Freq: Three times a day (TID) | ORAL | Status: DC

## 2014-11-04 NOTE — Discharge Instructions (Signed)
You have a dental infection. Please start the clindamycin to stop the infection. Please start taking 1-2 Aleve twice a day for pain and inflammation. Please only use the hydrocodone for extreme/severe pain. Please follow-up with the dentist on March 7

## 2014-11-04 NOTE — ED Provider Notes (Addendum)
CSN: [id number]     Arrival date & time 11/04/14  1216 History   First MD Initiated Contact with Patient 11/04/14 1348     No chief complaint on file.  (Consider location/radiation/quality/duration/timing/severity/associated sxs/prior Treatment) HPI  Upper L molar. 3 mo ago tooth broke off. Pt w/ dental appt on 11/15/14. Started becoming painful 7 days ago. Comes and goes at first but constant for past 24 hrs. BCs and tylenol  w/o improvement. Denies fevers, CP, SOB, rash, difficulty swallowing. Using 1-2 tramadol every 3-5 hours for pain. Patient reports pushing on painful area now and causing purulent discharge last night.  History reviewed. No pertinent past medical history. Past Surgical History  Procedure Laterality Date  . Right leg surgery     Family History  Problem Relation Age of Onset  . Cancer Neg Hx   . Diabetes Neg Hx   . Heart failure Neg Hx   . Hyperlipidemia Neg Hx   . Migraines Neg Hx   . Hypertension Neg Hx    History  Substance Use Topics  . Smoking status: Current Every Day Smoker -- 0.00 packs/day  . Smokeless tobacco: Not on file  . Alcohol Use: Yes     Comment: occ    Review of Systems Per HPI with all other pertinent systems negative.   Allergies  Review of patient's allergies indicates no known allergies.  Home Medications   Prior to Admission medications   Medication Sig Start Date End Date Taking? Authorizing Provider  clindamycin (CLEOCIN) 300 MG capsule Take 1 capsule (300 mg total) by mouth 3 (three) times daily. 11/04/14   Ozella Rocksavid J Merrell, MD  HYDROcodone-acetaminophen (NORCO/VICODIN) 5-325 MG per tablet Take 1 tablet by mouth every 6 (six) hours as needed for moderate pain. 11/04/14   Ozella Rocksavid J Merrell, MD   BP 145/76 mmHg  Pulse 73  Temp(Src) 98 F (36.7 C) (Oral)  Resp 16  SpO2 98% Physical Exam  Constitutional: He is oriented to person, place, and time. He appears well-developed and well-nourished.  HENT:  Numerous dental caries with  left most posterior mandibular molar black and broken off at the level of the gums with surrounding erythema and tenderness. No purulence noted. No fluctuance.  Eyes: Conjunctivae are normal. Pupils are equal, round, and reactive to light.  Neck: Normal range of motion. Neck supple.  Cardiovascular: Normal rate, normal heart sounds and intact distal pulses.   No murmur heard. Pulmonary/Chest: Effort normal and breath sounds normal.  Abdominal: Soft. Bowel sounds are normal.  Musculoskeletal: Normal range of motion.  Neurological: He is alert and oriented to person, place, and time.  Skin: Skin is warm.  Psychiatric: He has a normal mood and affect. His behavior is normal.    ED Course  Procedures (including critical care time) Labs Review Labs Reviewed - No data to display  Imaging Review No results found.   MDM   1. Dental infection    Start clindamycin Start Aleve 10 Vicodin provided Follow up with dentist Daily oral rinse.  Patient motivated to stop smoking and is currently using a vapor product. Patient encouraged to continue to try to quit.  Precautions given and all questions answered  Shelly Flattenavid Merrell, MD Family Medicine 11/04/2014, 2:08 PM     Ozella Rocksavid J Merrell, MD 11/04/14 1408  Ozella Rocksavid J Merrell, MD 11/04/14 [id number]

## 2014-11-04 NOTE — ED Notes (Signed)
Patient complains of a toothache.  Pain for three months.

## 2014-12-01 ENCOUNTER — Emergency Department: Payer: Self-pay | Admitting: Student

## 2015-11-21 ENCOUNTER — Emergency Department
Admission: EM | Admit: 2015-11-21 | Discharge: 2015-11-21 | Disposition: A | Discharge status: Home / Self Care | Payer: BLUE CROSS/BLUE SHIELD

## 2015-11-21 NOTE — ED Notes (Signed)
Pt called to triage.  Reports he is better and does not need to be seen.  Instructed pt to return if sx come back or he fells he needs to be seen.  NAD. Pt alert and oriented.

## 2017-08-20 ENCOUNTER — Other Ambulatory Visit: Payer: Self-pay

## 2017-08-20 ENCOUNTER — Encounter: Payer: Self-pay | Admitting: Emergency Medicine

## 2017-08-20 ENCOUNTER — Emergency Department
Admission: EM | Admit: 2017-08-20 | Discharge: 2017-08-20 | Disposition: A | Discharge status: Home / Self Care | Payer: BLUE CROSS/BLUE SHIELD | Attending: Emergency Medicine | Admitting: Emergency Medicine

## 2017-08-20 DIAGNOSIS — Z87891 Personal history of nicotine dependence: Secondary | ICD-10-CM | POA: Insufficient documentation

## 2017-08-20 DIAGNOSIS — K029 Dental caries, unspecified: Secondary | ICD-10-CM | POA: Insufficient documentation

## 2017-08-20 DIAGNOSIS — K047 Periapical abscess without sinus: Secondary | ICD-10-CM

## 2017-08-20 MED ORDER — TRAMADOL HCL 50 MG PO TABS: 50.0000 mg | ORAL_TABLET | Freq: Four times a day (QID) | ORAL | 0 refills | Status: AC | PRN

## 2017-08-20 MED ORDER — IBUPROFEN 800 MG PO TABS: 800.0000 mg | ORAL_TABLET | Freq: Three times a day (TID) | ORAL | 0 refills | Status: DC | PRN

## 2017-08-20 MED ORDER — LIDOCAINE VISCOUS 2 % MT SOLN: 5.0000 mL | Freq: Four times a day (QID) | OROMUCOSAL | 0 refills | Status: DC | PRN

## 2017-08-20 NOTE — ED Provider Notes (Signed)
[facility] Emergency Department Provider Note   ____________________________________________   First MD Initiated Contact with Patient 08/20/17 [phone]     (approximate)  I have reviewed the triage vital signs and the nursing notes.   HISTORY  Chief Complaint Dental Pain    HPI Justin DoomsJames Marshall Pasquarella Jr. is a 41 y.o. male patient has a dental pain for 1 week. Patient saw a dentist on 08/16/2017 was given amoxicillin and 2 days supply of Percocet. Patient states rescheduled see the dentist for extraction on 08/28/2017. Patient is here only for pain relief. Patient rates pain as a 10 over 10.   History reviewed. No pertinent past medical history.  There are no active problems to display for this patient.   Past Surgical History:  Procedure Laterality Date  . right leg surgery      Prior to Admission medications   Medication Sig Start Date End Date Taking? Authorizing Provider  clindamycin (CLEOCIN) 300 MG capsule Take 1 capsule (300 mg total) by mouth 3 (three) times daily. 11/04/14   Ozella RocksMerrell, David J, MD  HYDROcodone-acetaminophen (NORCO/VICODIN) 5-325 MG per tablet Take 1 tablet by mouth every 6 (six) hours as needed for moderate pain. 11/04/14   Ozella RocksMerrell, David J, MD  ibuprofen (ADVIL,MOTRIN) 800 MG tablet Take 1 tablet (800 mg total) by mouth every 8 (eight) hours as needed for moderate pain. 08/20/17   Joni ReiningSmith, Marte Celani K, PA-C  lidocaine (XYLOCAINE) 2 % solution Use as directed 5 mLs in the mouth or throat every 6 (six) hours as needed for mouth pain. 08/20/17   Joni ReiningSmith, Bristal Steffy K, PA-C  traMADol (ULTRAM) 50 MG tablet Take 1 tablet (50 mg total) by mouth every 6 (six) hours as needed. 08/20/17 08/20/18  Joni ReiningSmith, Caralynn Gelber K, PA-C    Allergies Patient has no known allergies.  Family History  Problem Relation Age of Onset  . Cancer Neg Hx   . Diabetes Neg Hx   . Heart failure Neg Hx   . Hyperlipidemia Neg Hx   . Migraines Neg Hx   . Hypertension Neg Hx      Social History Social History   Tobacco Use  . Smoking status: Former Smoker    Packs/day: 0.00  . Smokeless tobacco: Never Used  Substance Use Topics  . Alcohol use: Yes    Comment: occ  . Drug use: No    Review of Systems Constitutional: No fever/chills Eyes: No visual changes. ENT: No sore throat. Dental pain Cardiovascular: Denies chest pain. Respiratory: Denies shortness of breath. Gastrointestinal: No abdominal pain.  No nausea, no vomiting.  No diarrhea.  No constipation. Genitourinary: Negative for dysuria. Musculoskeletal: Negative for back pain. Skin: Negative for rash. Neurological: Negative for headaches, focal weakness or numbness.   ____________________________________________   PHYSICAL EXAM:  VITAL SIGNS: ED Triage Vitals  Enc Vitals Group     BP 08/20/17 0900 (!) 153/89     Pulse Rate 08/20/17 0900 78     Resp 08/20/17 0900 16     Temp 08/20/17 0900 97.6 F (36.4 C)     Temp Source 08/20/17 0900 Oral     SpO2 08/20/17 0900 99 %     Weight 08/20/17 0900 185 lb (83.9 kg)     Height 08/20/17 0900 5\' 11"  (1.803 m)     Head Circumference --      Peak Flow --      Pain Score 08/20/17 0859 10     Pain Loc --  Pain Edu? --      Excl. in GC? --     Constitutional: Alert and oriented. Well appearing and in no acute distress. Eyes: Conjunctivae are normal. PERRL. EOMI. Head: Atraumatic. Nose: No congestion/rhinnorhea. Mouth/Throat: Mucous membranes are moist.  Oropharynx non-erythematous. Devitalize tooth #18 and 19 with gingival edema. Neck: No stridor.  No cervical spine tenderness to palpation. Hematological/Lymphatic/Immunilogical: No cervical lymphadenopathy. Cardiovascular: Normal rate, regular rhythm. Grossly normal heart sounds.  Good peripheral circulation. Elevated blood pressure Respiratory: Normal respiratory effort.  No retractions. Lungs CTAB. Gastrointestinal: Soft and nontender. No distention. No abdominal bruits. No CVA  tenderness. Musculoskeletal: No lower extremity tenderness nor edema.  No joint effusions. Neurologic:  Normal speech and language. No gross focal neurologic deficits are appreciated. No gait instability. Skin:  Skin is warm, dry and intact. No rash noted. Psychiatric: Mood and affect are normal. Speech and behavior are normal.  ____________________________________________   LABS (all labs ordered are listed, but only abnormal results are displayed)  Labs Reviewed - No data to display ____________________________________________  EKG   ____________________________________________  RADIOLOGY  No results found.  ____________________________________________   PROCEDURES  Procedure(s) performed: None  Procedures  Critical Care performed: No  ____________________________________________   INITIAL IMPRESSION / ASSESSMENT AND PLAN / ED COURSE  As part of my medical decision making, I reviewed the following data within the electronic MEDICAL RECORD NUMBER    Dental pain secondary to fractured and dental caries. Patient given discharge care instructions. Patient advised to follow-up with scheduled appointment. Patient given prescription for tramadol, ibuprofen, and viscous lidocaine.      ____________________________________________   FINAL CLINICAL IMPRESSION(S) / ED DIAGNOSES  Final diagnoses:  Pain due to dental caries  Dental abscess     ED Discharge Orders        Ordered    traMADol (ULTRAM) 50 MG tablet  Every 6 hours PRN     08/20/17 1009    ibuprofen (ADVIL,MOTRIN) 800 MG tablet  Every 8 hours PRN     08/20/17 1009    lidocaine (XYLOCAINE) 2 % solution  Every 6 hours PRN     08/20/17 1009       Note:  This document was prepared using Dragon voice recognition software and may include unintentional dictation errors.    Joni ReiningSmith, Armoni Kludt K, PA-C 08/20/17 1010    Don PerkingVeronese, [place], MD 08/23/17 [phone]

## 2017-08-20 NOTE — Discharge Instructions (Signed)
Follow-up with scheduled dental appointment on 08/28/2017.

## 2017-08-20 NOTE — ED Triage Notes (Signed)
Pt c/o toothache for the past week. States he has an apt 12/19 but is having to much pain to wait.

## 2017-09-13 ENCOUNTER — Emergency Department
Admission: EM | Admit: 2017-09-13 | Discharge: 2017-09-13 | Disposition: A | Discharge status: Home / Self Care | Payer: Self-pay | Attending: Student in an Organized Health Care Education/Training Program | Admitting: Student in an Organized Health Care Education/Training Program

## 2017-09-13 ENCOUNTER — Encounter: Payer: Self-pay | Admitting: Emergency Medicine

## 2017-09-13 ENCOUNTER — Emergency Department: Payer: Self-pay

## 2017-09-13 ENCOUNTER — Other Ambulatory Visit: Payer: Self-pay

## 2017-09-13 DIAGNOSIS — Z79899 Other long term (current) drug therapy: Secondary | ICD-10-CM | POA: Insufficient documentation

## 2017-09-13 DIAGNOSIS — Y999 Unspecified external cause status: Secondary | ICD-10-CM | POA: Insufficient documentation

## 2017-09-13 DIAGNOSIS — W11XXXA Fall on and from ladder, initial encounter: Secondary | ICD-10-CM | POA: Insufficient documentation

## 2017-09-13 DIAGNOSIS — Y9389 Activity, other specified: Secondary | ICD-10-CM | POA: Insufficient documentation

## 2017-09-13 DIAGNOSIS — Y9289 Other specified places as the place of occurrence of the external cause: Secondary | ICD-10-CM | POA: Insufficient documentation

## 2017-09-13 DIAGNOSIS — S62342A Nondisplaced fracture of base of third metacarpal bone, right hand, initial encounter for closed fracture: Secondary | ICD-10-CM | POA: Insufficient documentation

## 2017-09-13 DIAGNOSIS — Z87891 Personal history of nicotine dependence: Secondary | ICD-10-CM | POA: Insufficient documentation

## 2017-09-13 MED ORDER — OXYCODONE-ACETAMINOPHEN 5-325 MG PO TABS: 1.0000 | ORAL_TABLET | Freq: Four times a day (QID) | ORAL | 0 refills | Status: DC | PRN

## 2017-09-13 MED ORDER — OXYCODONE-ACETAMINOPHEN 5-325 MG PO TABS
1.0000 | ORAL_TABLET | Freq: Once | ORAL | Status: AC
  Administered 2017-09-13: 1 via ORAL
  Filled 2017-09-13: qty 1

## 2017-09-13 NOTE — ED Notes (Signed)
Pt alert and oriented X4, active, cooperative, pt in NAD. RR even and unlabored, color WNL.  Pt informed to return if any life threatening symptoms occur.  Discharge and followup instructions reviewed.  

## 2017-09-13 NOTE — ED Notes (Signed)
See triage note  States he fell from approx 11 ft  States he landed on right hand  Denies any other sx's  Ambulates well  Did not hit his head

## 2017-09-13 NOTE — ED Provider Notes (Signed)
[facility] Emergency Department Provider Note  ____________________________________________  Time seen: Approximately 4:07 PM  I have reviewed the triage vital signs and the nursing notes.   HISTORY  Chief Complaint Hand Injury    HPI Justin Hicks. is a 42 y.o. male who presents emergency department complaining of right hand pain, swelling status post injury.  Patient reports that yesterday he was at work, fell off of a ladder approximately 11 feet and landed on his right hand.  Patient did not hit his head or lose consciousness.  He denies any other pain complaint.  Patient reports that he went to work today but was unable to fully his duties due to the amount of pain and swelling.  Patient denies any wrist, forearm, elbow, or shoulder pain.  He has not take any medications for this complaint prior to arrival.  No history of previous injury or fracture to this hand.  History reviewed. No pertinent past medical history.  There are no active problems to display for this patient.   Past Surgical History:  Procedure Laterality Date  . right leg surgery      Prior to Admission medications   Medication Sig Start Date End Date Taking? Authorizing Provider  clindamycin (CLEOCIN) 300 MG capsule Take 1 capsule (300 mg total) by mouth 3 (three) times daily. 11/04/14   Ozella Rocks, MD  HYDROcodone-acetaminophen (NORCO/VICODIN) 5-325 MG per tablet Take 1 tablet by mouth every 6 (six) hours as needed for moderate pain. 11/04/14   Ozella Rocks, MD  ibuprofen (ADVIL,MOTRIN) 800 MG tablet Take 1 tablet (800 mg total) by mouth every 8 (eight) hours as needed for moderate pain. 08/20/17   Joni Reining, PA-C  lidocaine (XYLOCAINE) 2 % solution Use as directed 5 mLs in the mouth or throat every 6 (six) hours as needed for mouth pain. 08/20/17   Joni Reining, PA-C  oxyCODONE-acetaminophen (ROXICET) 5-325 MG tablet Take 1 tablet by mouth every 6 (six)  hours as needed for severe pain. 09/13/17   Eveline Sauve, Delorise Royals, PA-C  traMADol (ULTRAM) 50 MG tablet Take 1 tablet (50 mg total) by mouth every 6 (six) hours as needed. 08/20/17 08/20/18  Joni Reining, PA-C    Allergies Patient has no known allergies.  Family History  Problem Relation Age of Onset  . Cancer Neg Hx   . Diabetes Neg Hx   . Heart failure Neg Hx   . Hyperlipidemia Neg Hx   . Migraines Neg Hx   . Hypertension Neg Hx     Social History Social History   Tobacco Use  . Smoking status: Former Smoker    Packs/day: 0.00  . Smokeless tobacco: Never Used  Substance Use Topics  . Alcohol use: Yes    Comment: occ  . Drug use: No     Review of Systems  Constitutional: No fever/chills Eyes: No visual changes.  Cardiovascular: no chest pain. Respiratory: no cough. No SOB. Gastrointestinal: No abdominal pain.  No nausea, no vomiting. Musculoskeletal: Positive for right hand injury and pain Skin: Negative for rash, abrasions, lacerations, ecchymosis. Neurological: Negative for headaches, focal weakness or numbness. 10-point ROS otherwise negative.  ____________________________________________   PHYSICAL EXAM:  VITAL SIGNS: ED Triage Vitals  Enc Vitals Group     BP 09/13/17 1542 140/73     Pulse Rate 09/13/17 1542 67     Resp 09/13/17 1542 18     Temp 09/13/17 1542 98.3 F (36.8 C)  Temp Source 09/13/17 1542 Oral     SpO2 09/13/17 1542 99 %     Weight 09/13/17 1540 185 lb (83.9 kg)     Height 09/13/17 1540 5\' 11"  (1.803 m)     Head Circumference --      Peak Flow --      Pain Score 09/13/17 1539 10     Pain Loc --      Pain Edu? --      Excl. in GC? --      Constitutional: Alert and oriented. Well appearing and in no acute distress. Eyes: Conjunctivae are normal. PERRL. EOMI. Head: Atraumatic. Neck: No stridor.    Cardiovascular: Normal rate, regular rhythm. Normal S1 and S2.  Good peripheral circulation. Respiratory: Normal respiratory  effort without tachypnea or retractions. Lungs CTAB. Good air entry to the bases with no decreased or absent breath sounds. Musculoskeletal: Full range of motion to all extremities. No gross deformities appreciated.  Patient with obvious edema but no deformity to the right hand.  Patient has full range of motion to the wrist and all 5 digits.  Patient is very tender to palpation over the second, third, fourth metacarpal bone regions.  No palpable abnormality.  Sensation intact all 5 digits.  Capillary refill intact all 5 digits. Neurologic:  Normal speech and language. No gross focal neurologic deficits are appreciated.  Skin:  Skin is warm, dry and intact. No rash noted. Psychiatric: Mood and affect are normal. Speech and behavior are normal. Patient exhibits appropriate insight and judgement.   ____________________________________________   LABS (all labs ordered are listed, but only abnormal results are displayed)  Labs Reviewed - No data to display ____________________________________________  EKG   ____________________________________________  RADIOLOGY Festus Barren General Wearing, personally viewed and evaluated these images (plain radiographs) as part of my medical decision making, as well as reviewing the written report by the radiologist.  Dg Hand Complete Right  Result Date: 09/13/2017 CLINICAL DATA:  Larey Seat from a ladder at work with generalized pain. EXAM: RIGHT HAND - COMPLETE 3+ VIEW COMPARISON:  01/27/2014 FINDINGS: Nondisplaced fracture at the proximal diaphyseal metaphyseal junction of the metacarpal of the long finger. No other injury seen. IMPRESSION: Nondisplaced fracture of the proximal metacarpal of the long finger. Electronically Signed   By: Paulina Fusi M.D.   On: 09/13/2017 16:03    ____________________________________________    PROCEDURES  Procedure(s) performed:    .Splint Application Date/Time: 09/13/2017 10:02 PM Performed by: Racheal Patches,  PA-C Authorized by: Racheal Patches, PA-C   Consent:    Consent obtained:  Verbal   Consent given by:  Patient   Risks discussed:  Pain and swelling Pre-procedure details:    Sensation:  Normal Procedure details:    Laterality:  Right   Location:  Hand   Hand:  R hand   Splint type:  Volar short arm   Supplies:  Cotton padding, Ortho-Glass and elastic bandage Post-procedure details:    Pain:  Improved   Sensation:  Normal   Patient tolerance of procedure:  Tolerated well, no immediate complications      Medications  oxyCODONE-acetaminophen (PERCOCET/ROXICET) 5-325 MG per tablet 1 tablet (1 tablet Oral Given 09/13/17 1627)     ____________________________________________   INITIAL IMPRESSION / ASSESSMENT AND PLAN / ED COURSE  Pertinent labs & imaging results that were available during my care of the patient were reviewed by me and considered in my medical decision making (see chart for details).  Review of  the [place] CSRS was performed in accordance of the [facility] prior to dispensing any controlled drugs.     Patient's diagnosis is consistent with third metacarpal fracture.  X-ray reveals the above diagnosis.  Differential included fracture versus sprain versus contusion.  Splint is applied as described above.  Patient tolerated well.. Patient will be discharged home with prescriptions for Percocet. Patient is to follow up with orthopedics as needed or otherwise directed. Patient is given ED precautions to return to the ED for any worsening or new symptoms.     ____________________________________________  FINAL CLINICAL IMPRESSION(S) / ED DIAGNOSES  Final diagnoses:  Closed nondisplaced fracture of base of third metacarpal bone of right hand, initial encounter      NEW MEDICATIONS STARTED DURING THIS VISIT:  ED Discharge Orders        Ordered    oxyCODONE-acetaminophen (ROXICET) 5-325 MG tablet  Every 6 hours PRN     09/13/17 1617          This chart  was dictated using voice recognition software/Dragon. Despite best efforts to proofread, errors can occur which can change the meaning. Any change was purely unintentional.    Racheal PatchesCuthriell, Eward Rutigliano D, PA-C 09/13/17 2202    Willy Eddyobinson, Patrick, MD 09/13/17 [phone]

## 2017-09-13 NOTE — ED Triage Notes (Signed)
Pt fell from 11 ft ladder landing on right hand. Only pain to right hand. Moving all extremities. Did not hit head. No neck or back pain. Able to lean on right elbow. No right shoulder pain. No LOC. No complaints other than right hand.

## 2017-09-17 ENCOUNTER — Emergency Department
Admission: EM | Admit: 2017-09-17 | Discharge: 2017-09-17 | Disposition: A | Discharge status: Home / Self Care | Payer: Self-pay | Attending: Emergency Medicine | Admitting: Emergency Medicine

## 2017-09-17 ENCOUNTER — Other Ambulatory Visit: Payer: Self-pay

## 2017-09-17 ENCOUNTER — Encounter: Payer: Self-pay | Admitting: Emergency Medicine

## 2017-09-17 ENCOUNTER — Emergency Department: Payer: Self-pay

## 2017-09-17 DIAGNOSIS — S6291XS Unspecified fracture of right wrist and hand, sequela: Secondary | ICD-10-CM

## 2017-09-17 DIAGNOSIS — Z79899 Other long term (current) drug therapy: Secondary | ICD-10-CM | POA: Insufficient documentation

## 2017-09-17 DIAGNOSIS — S6291XD Unspecified fracture of right wrist and hand, subsequent encounter for fracture with routine healing: Secondary | ICD-10-CM | POA: Insufficient documentation

## 2017-09-17 DIAGNOSIS — Z87891 Personal history of nicotine dependence: Secondary | ICD-10-CM | POA: Insufficient documentation

## 2017-09-17 DIAGNOSIS — X58XXXD Exposure to other specified factors, subsequent encounter: Secondary | ICD-10-CM | POA: Insufficient documentation

## 2017-09-17 MED ORDER — IBUPROFEN 800 MG PO TABS
800.0000 mg | ORAL_TABLET | Freq: Once | ORAL | Status: AC
  Administered 2017-09-17: 800 mg via ORAL
  Filled 2017-09-17: qty 1

## 2017-09-17 MED ORDER — OXYCODONE-ACETAMINOPHEN 5-325 MG PO TABS
1.0000 | ORAL_TABLET | Freq: Once | ORAL | Status: AC
  Administered 2017-09-17: 1 via ORAL
  Filled 2017-09-17: qty 1

## 2017-09-17 MED ORDER — IBUPROFEN 800 MG PO TABS: 800.0000 mg | ORAL_TABLET | Freq: Three times a day (TID) | ORAL | 0 refills | Status: DC | PRN

## 2017-09-17 MED ORDER — OXYCODONE-ACETAMINOPHEN 7.5-325 MG PO TABS: 1.0000 | ORAL_TABLET | Freq: Four times a day (QID) | ORAL | 0 refills | Status: DC | PRN

## 2017-09-17 NOTE — ED Notes (Signed)
Patients wife arrived in room to drive patient home

## 2017-09-17 NOTE — Discharge Instructions (Signed)
Splint and sling until evaluation by orthopedics. Call today for an appointment

## 2017-09-17 NOTE — ED Triage Notes (Signed)
Pt here on Saturday for hand injury and seen.  Was broken and placed in splint. Pt removed splint to keep working; does [employer]. Thinks has been over using hand and now has increasing pain and some tingling feeling in thumb.  Ambulatory.

## 2017-09-17 NOTE — ED Provider Notes (Signed)
[facility] Emergency Department Provider Note   ____________________________________________   First MD Initiated Contact with Patient 09/17/17 1425     (approximate)  I have reviewed the triage vital signs and the nursing notes.   HISTORY  Chief Complaint Hand Problem    HPI Justin Hicks. is a 42 y.o. male patient presents for right hand pain. Patient was seen in this department 3 days ago and was found to have a fractured third metatarsal. Patient was placed in a splint and advised follow-up with orthopedics. Patient state  removed the splint so he could work and reports   increasing pain and tingling feeling in his thumb. Patient did not contact orthopedic department for follow-up. Patient rates the pain as a 10 over 10. Patient had a pain as "achy". Patient is right-hand dominant.   History reviewed. No pertinent past medical history.  There are no active problems to display for this patient.   Past Surgical History:  Procedure Laterality Date  . right leg surgery      Prior to Admission medications   Medication Sig Start Date End Date Taking? Authorizing Provider  clindamycin (CLEOCIN) 300 MG capsule Take 1 capsule (300 mg total) by mouth 3 (three) times daily. 11/04/14   Ozella Rocks, MD  HYDROcodone-acetaminophen (NORCO/VICODIN) 5-325 MG per tablet Take 1 tablet by mouth every 6 (six) hours as needed for moderate pain. 11/04/14   Ozella Rocks, MD  ibuprofen (ADVIL,MOTRIN) 800 MG tablet Take 1 tablet (800 mg total) by mouth every 8 (eight) hours as needed for moderate pain. 08/20/17   Joni Reining, PA-C  ibuprofen (ADVIL,MOTRIN) 800 MG tablet Take 1 tablet (800 mg total) by mouth every 8 (eight) hours as needed. 09/17/17   Joni Reining, PA-C  lidocaine (XYLOCAINE) 2 % solution Use as directed 5 mLs in the mouth or throat every 6 (six) hours as needed for mouth pain. 08/20/17   Joni Reining, PA-C    oxyCODONE-acetaminophen (PERCOCET) 7.5-325 MG tablet Take 1 tablet by mouth every 6 (six) hours as needed for severe pain. 09/17/17   Joni Reining, PA-C  oxyCODONE-acetaminophen (ROXICET) 5-325 MG tablet Take 1 tablet by mouth every 6 (six) hours as needed for severe pain. 09/13/17   Cuthriell, Delorise Royals, PA-C  traMADol (ULTRAM) 50 MG tablet Take 1 tablet (50 mg total) by mouth every 6 (six) hours as needed. 08/20/17 08/20/18  Joni Reining, PA-C    Allergies Patient has no known allergies.  Family History  Problem Relation Age of Onset  . Cancer Neg Hx   . Diabetes Neg Hx   . Heart failure Neg Hx   . Hyperlipidemia Neg Hx   . Migraines Neg Hx   . Hypertension Neg Hx     Social History Social History   Tobacco Use  . Smoking status: Former Smoker    Packs/day: 0.00  . Smokeless tobacco: Never Used  Substance Use Topics  . Alcohol use: Yes    Comment: occ  . Drug use: No    Review of Systems Constitutional: No fever/chills Eyes: No visual changes. ENT: No sore throat. Cardiovascular: Denies chest pain. Respiratory: Denies shortness of breath. Gastrointestinal: No abdominal pain.  No nausea, no vomiting.  No diarrhea.  No constipation. Genitourinary: Negative for dysuria. Musculoskeletal: Right hand pain and edema Skin: Negative for rash. Neurological: Negative for headaches, focal weakness or numbness.  ____________________________________________   PHYSICAL EXAM:  VITAL SIGNS: ED Triage Vitals  Enc Vitals Group     BP 09/17/17 1318 140/87     Pulse Rate 09/17/17 1318 75     Resp 09/17/17 1318 16     Temp 09/17/17 1318 97.8 F (36.6 C)     Temp Source 09/17/17 1318 Oral     SpO2 09/17/17 1318 97 %     Weight 09/17/17 1314 180 lb (81.6 kg)     Height 09/17/17 1314 5\' 11"  (1.803 m)     Head Circumference --      Peak Flow --      Pain Score 09/17/17 1314 10     Pain Loc --      Pain Edu? --      Excl. in GC? --    Constitutional: Alert and oriented.  Well appearing and in no acute distress. Neck: No stridor.  Cardiovascular: Normal rate, regular rhythm. Grossly normal heart sounds.  Good peripheral circulation. Respiratory: Normal respiratory effort.  No retractions. Lungs CTAB. Musculoskeletal: No obvious deformities the right hand. Marked edema is appreciated. Patient has full range of motion. Neurologic:  Normal speech and language. No gross focal neurologic deficits are appreciated. No gait instability. Skin:  Skin is warm, dry and intact. No rash noted. Moderate edema dose aspect of the right hand Psychiatric: Mood and affect are normal. Speech and behavior are normal.  ____________________________________________   LABS (all labs ordered are listed, but only abnormal results are displayed)  Labs Reviewed - No data to display ____________________________________________  EKG   ____________________________________________  RADIOLOGY  Dg Hand 2 View Right  Result Date: 09/17/2017 CLINICAL DATA:  Right hand injury. EXAM: RIGHT HAND - 2 VIEW COMPARISON:  09/13/2017. FINDINGS: An oblique fracture of the base of the third metacarpal is noted. This is unchanged in appearance from 09/13/2017. IMPRESSION: Unchanged appearance of oblique fracture of the base of the right third metacarpal. No change in alignment. No callus formation noted. Electronically Signed   By: Maisie Fushomas  Register   On: 09/17/2017 14:08    ____________________________________________   PROCEDURES  Procedure(s) performed: None  Procedures  Critical Care performed: No  ____________________________________________   INITIAL IMPRESSION / ASSESSMENT AND PLAN / ED COURSE  As part of my medical decision making, I reviewed the following data within the electronic MEDICAL RECORD NUMBER    Right hand pain secondary to fracture. X-ray of the hand shows no change from previous exam 3 days ago. Patient again if we place an OCL splint. Patient also placed in a sling.  Patient given discharge Instructions advised to take medications directed. Patient advised to contact orthopedic department upon departure to schedule follow-up appointment. Status post application of splint patient capillary refill less than 2 seconds and he was able to move all of his fingers.      ____________________________________________   FINAL CLINICAL IMPRESSION(S) / ED DIAGNOSES  Final diagnoses:  Closed fracture of right hand, sequela     ED Discharge Orders        Ordered    ibuprofen (ADVIL,MOTRIN) 800 MG tablet  Every 8 hours PRN     09/17/17 1423    oxyCODONE-acetaminophen (PERCOCET) 7.5-325 MG tablet  Every 6 hours PRN     09/17/17 1423       Note:  This document was prepared using Dragon voice recognition software and may include unintentional dictation errors.    Joni ReiningSmith, Galvin Aversa K, PA-C 09/17/17 1431    Sharman CheekStafford, Phillip, MD 09/17/17 1535

## 2018-12-23 IMAGING — DX DG HAND 2V*R*
2 series · 2 of 2 positions shown · non-contrast
Comparison: 09/13/2017.

CLINICAL DATA: Right hand injury.

EXAM:
RIGHT HAND - 2 VIEW

[hand ap]
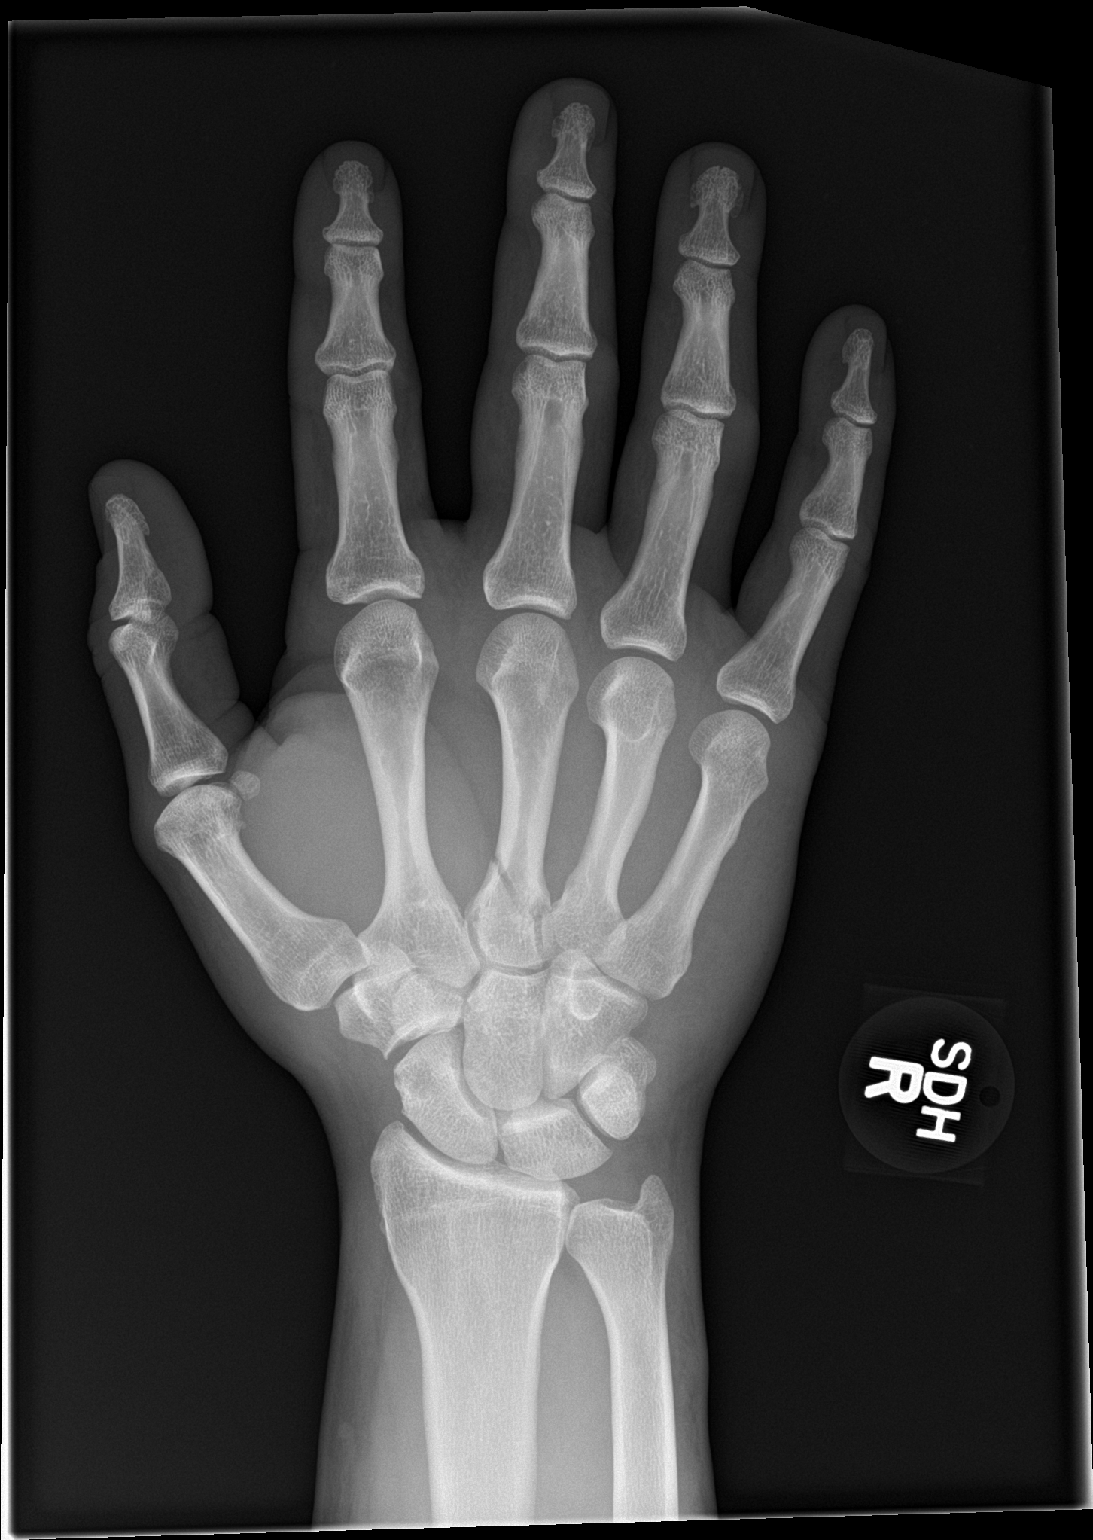

[hand lat]
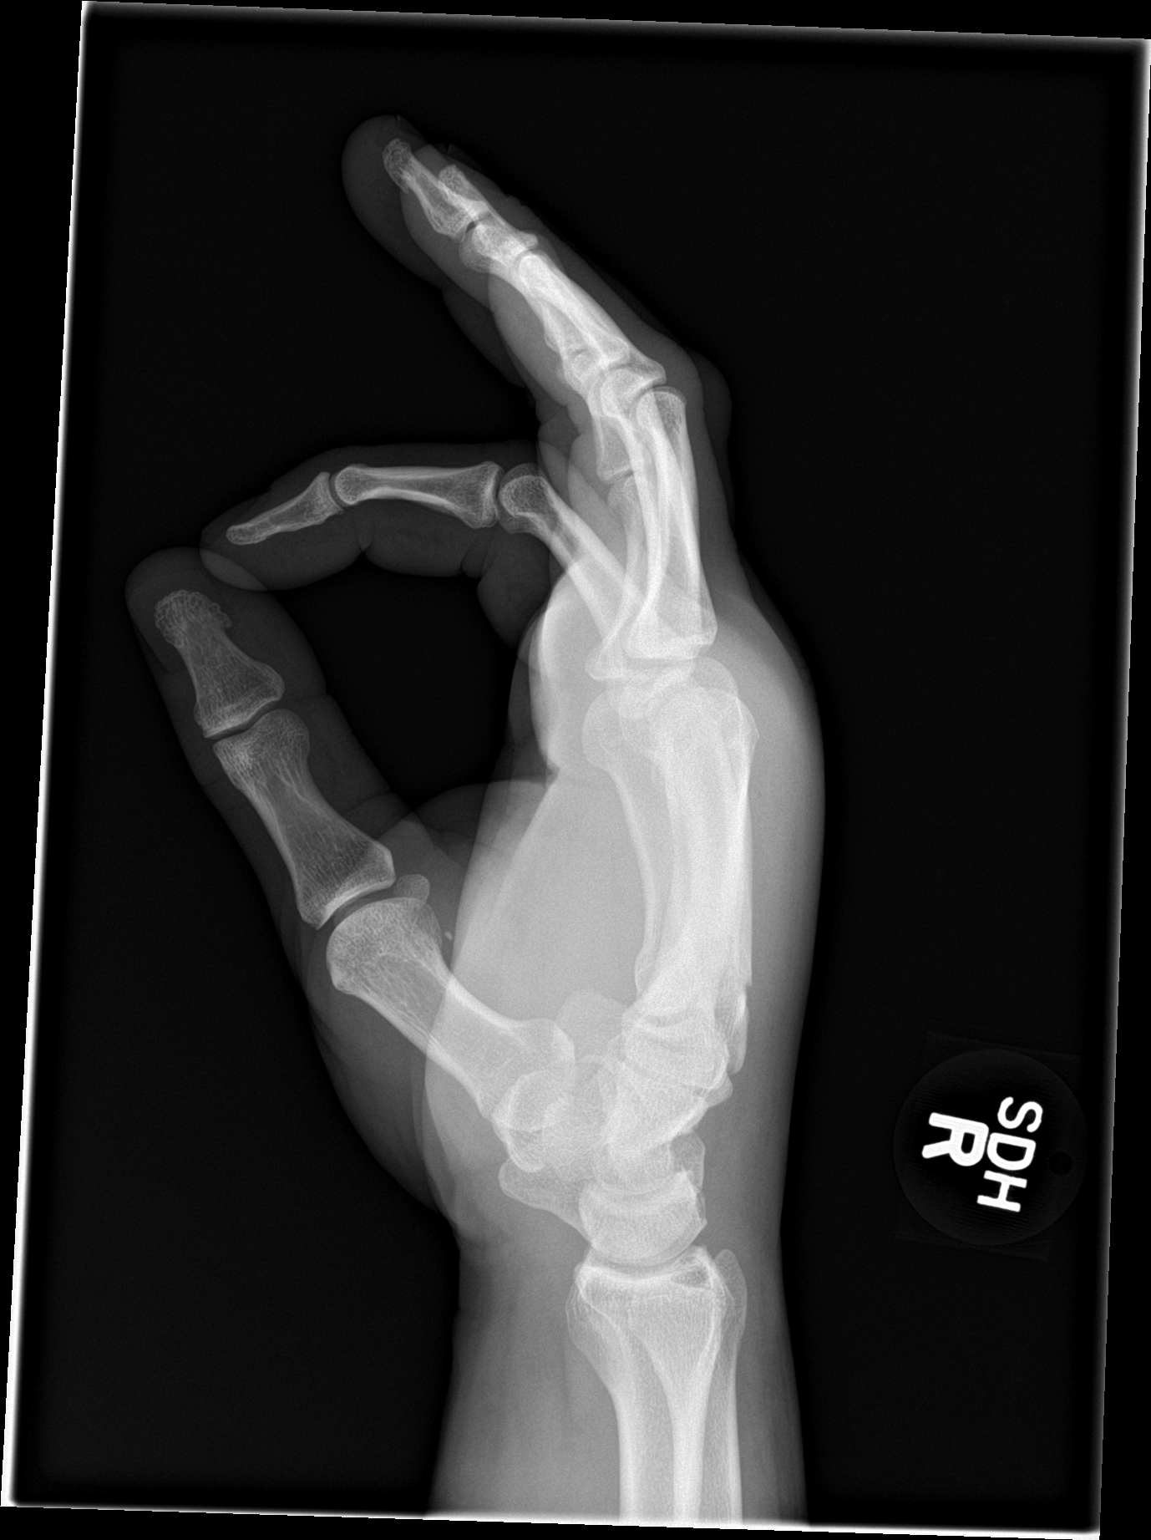

[2 of 2 positions shown; findings below may reference images not displayed]

FINDINGS: An oblique fracture of the base of the third metacarpal is noted.
This is unchanged in appearance from 09/13/2017.
IMPRESSION: Unchanged appearance of oblique fracture of the base of the right
third metacarpal. No change in alignment. No callus formation noted.

## 2019-04-23 ENCOUNTER — Emergency Department
Admission: EM | Admit: 2019-04-23 | Discharge: 2019-04-24 | Disposition: A | Discharge status: Other Acute Inpatient Hospital | Payer: Self-pay | Attending: Student | Admitting: Student

## 2019-04-23 ENCOUNTER — Encounter: Payer: Self-pay | Admitting: Emergency Medicine

## 2019-04-23 DIAGNOSIS — Z20828 Contact with and (suspected) exposure to other viral communicable diseases: Secondary | ICD-10-CM | POA: Insufficient documentation

## 2019-04-23 DIAGNOSIS — F1920 Other psychoactive substance dependence, uncomplicated: Secondary | ICD-10-CM | POA: Diagnosis present

## 2019-04-23 DIAGNOSIS — Z046 Encounter for general psychiatric examination, requested by authority: Secondary | ICD-10-CM

## 2019-04-23 DIAGNOSIS — Z87891 Personal history of nicotine dependence: Secondary | ICD-10-CM | POA: Insufficient documentation

## 2019-04-23 DIAGNOSIS — F1990 Other psychoactive substance use, unspecified, uncomplicated: Secondary | ICD-10-CM | POA: Insufficient documentation

## 2019-04-23 DIAGNOSIS — F1910 Other psychoactive substance abuse, uncomplicated: Secondary | ICD-10-CM | POA: Diagnosis present

## 2019-04-23 DIAGNOSIS — F332 Major depressive disorder, recurrent severe without psychotic features: Secondary | ICD-10-CM | POA: Diagnosis present

## 2019-04-23 DIAGNOSIS — F329 Major depressive disorder, single episode, unspecified: Secondary | ICD-10-CM | POA: Insufficient documentation

## 2019-04-23 DIAGNOSIS — R45851 Suicidal ideations: Secondary | ICD-10-CM | POA: Insufficient documentation

## 2019-04-23 LAB — URINE DRUG SCREEN, QUALITATIVE (ARMC ONLY)
Amphetamines, Ur Screen: NOT DETECTED
Barbiturates, Ur Screen: NOT DETECTED
Benzodiazepine, Ur Scrn: NOT DETECTED
Cannabinoid 50 Ng, Ur ~~LOC~~: POSITIVE — AB
Cocaine Metabolite,Ur ~~LOC~~: POSITIVE — AB
MDMA (Ecstasy)Ur Screen: NOT DETECTED
Methadone Scn, Ur: NOT DETECTED
Opiate, Ur Screen: POSITIVE — AB
Phencyclidine (PCP) Ur S: NOT DETECTED
Tricyclic, Ur Screen: NOT DETECTED

## 2019-04-23 LAB — COMPREHENSIVE METABOLIC PANEL
ALT: 22 U/L (ref 0–44)
AST: 26 U/L (ref 15–41)
Albumin: 5 g/dL (ref 3.5–5.0)
Alkaline Phosphatase: 64 U/L (ref 38–126)
Anion gap: 11 (ref 5–15)
BUN: 22 mg/dL — ABNORMAL HIGH (ref 6–20)
CO2: 24 mmol/L (ref 22–32)
Calcium: 9.1 mg/dL (ref 8.9–10.3)
Chloride: 101 mmol/L (ref 98–111)
Creatinine, Ser: 1.12 mg/dL (ref 0.61–1.24)
GFR calc Af Amer: >60 mL/min (ref >60)
GFR calc non Af Amer: >60 mL/min (ref >60)
Glucose, Bld: 95 mg/dL (ref 70–99)
Potassium: 3.9 mmol/L (ref 3.5–5.1)
Sodium: 136 mmol/L (ref 135–145)
Total Bilirubin: 0.7 mg/dL (ref 0.3–1.2)
Total Protein: 7.9 g/dL (ref 6.5–8.1)

## 2019-04-23 LAB — SALICYLATE LEVEL: Salicylate Lvl: <7 mg/dL (ref 2.8–30.0)

## 2019-04-23 LAB — CBC
HCT: 41.5 % (ref 39.0–52.0)
Hemoglobin: 14.3 g/dL (ref 13.0–17.0)
MCH: 32.3 pg (ref 26.0–34.0)
MCHC: 34.5 g/dL (ref 30.0–36.0)
MCV: 93.7 fL (ref 80.0–100.0)
Platelets: 342 10*3/uL (ref 150–400)
RBC: 4.43 MIL/uL (ref 4.22–5.81)
RDW: 12.7 % (ref 11.5–15.5)
WBC: 12.3 10*3/uL — ABNORMAL HIGH (ref 4.0–10.5)
nRBC: 0 % (ref 0.0–0.2)

## 2019-04-23 LAB — ACETAMINOPHEN LEVEL: Acetaminophen (Tylenol), Serum: <10 ug/mL — ABNORMAL LOW (ref 10–30)

## 2019-04-23 LAB — ETHANOL: Alcohol, Ethyl (B): <10 mg/dL (ref <10)

## 2019-04-23 NOTE — ED Notes (Signed)
[facility] PENDING  CONSULT ?

## 2019-04-23 NOTE — ED Provider Notes (Signed)
[facility] Emergency Department Provider Note  ____________________________________________   First MD Initiated Contact with Patient 04/23/19 2251     (approximate)  I have reviewed the triage vital signs and the nursing notes.  History  Chief Complaint Mental Health Problem    HPI Justin DoomsJames Marshall Mcmahill Jr. is a 43 y.o. male with history of substance abuse who presents under an IVC.  Per paperwork, the patient's wife pursued IVC after the patient allegedly sent text messages/phone calls implying that he was going to harm himself.  Patient himself denies SI, HI, AVH.  He has no acute complaints, is calm and cooperative.         Past Medical Hx History reviewed. No pertinent past medical history.  Problem List There are no active problems to display for this patient.   Past Surgical Hx Past Surgical History:  Procedure Laterality Date  . right leg surgery      Medications Prior to Admission medications   Medication Sig Start Date End Date Taking? Authorizing Provider  clindamycin (CLEOCIN) 300 MG capsule Take 1 capsule (300 mg total) by mouth 3 (three) times daily. 11/04/14   Ozella RocksMerrell, David J, MD  HYDROcodone-acetaminophen (NORCO/VICODIN) 5-325 MG per tablet Take 1 tablet by mouth every 6 (six) hours as needed for moderate pain. 11/04/14   Ozella RocksMerrell, David J, MD  ibuprofen (ADVIL,MOTRIN) 800 MG tablet Take 1 tablet (800 mg total) by mouth every 8 (eight) hours as needed for moderate pain. 08/20/17   Joni ReiningSmith, Ronald K, PA-C  ibuprofen (ADVIL,MOTRIN) 800 MG tablet Take 1 tablet (800 mg total) by mouth every 8 (eight) hours as needed. 09/17/17   Joni ReiningSmith, Ronald K, PA-C  lidocaine (XYLOCAINE) 2 % solution Use as directed 5 mLs in the mouth or throat every 6 (six) hours as needed for mouth pain. 08/20/17   Joni ReiningSmith, Ronald K, PA-C  oxyCODONE-acetaminophen (PERCOCET) 7.5-325 MG tablet Take 1 tablet by mouth every 6 (six) hours as needed for severe pain.  09/17/17   Joni ReiningSmith, Ronald K, PA-C  oxyCODONE-acetaminophen (ROXICET) 5-325 MG tablet Take 1 tablet by mouth every 6 (six) hours as needed for severe pain. 09/13/17   Cuthriell, Delorise RoyalsJonathan D, PA-C    Allergies Patient has no known allergies.  Family Hx Family History  Problem Relation Age of Onset  . Cancer Neg Hx   . Diabetes Neg Hx   . Heart failure Neg Hx   . Hyperlipidemia Neg Hx   . Migraines Neg Hx   . Hypertension Neg Hx     Social Hx Social History   Tobacco Use  . Smoking status: Former Smoker    Packs/day: 0.00  . Smokeless tobacco: Never Used  Substance Use Topics  . Alcohol use: Yes    Comment: occ  . Drug use: No     Review of Systems  Constitutional: Negative for fever. Negative for chills. Eyes: Negative for visual changes. ENT: Negative for sore throat. Cardiovascular: Negative for chest pain. Respiratory: Negative for shortness of breath. Gastrointestinal: Negative for abdominal pain. Negative for nausea. Negative for vomiting. Genitourinary: Negative for dysuria. Musculoskeletal: Negative for leg swelling. Skin: Negative for rash. Neurological: Negative for for headaches.   Physical Exam  Vital Signs: ED Triage Vitals  Enc Vitals Group     BP 04/23/19 2221 140/83     Pulse Rate 04/23/19 2221 77     Resp 04/23/19 2221 17     Temp 04/23/19 2221 99 F (37.2 C)     Temp  Source 04/23/19 2221 Oral     SpO2 04/23/19 2221 99 %     Weight --      Height --      Head Circumference --      Peak Flow --      Pain Score 04/23/19 2240 0     Pain Loc --      Pain Edu? --      Excl. in [place]? --     Constitutional: Alert and oriented.  Calm and cooperative. Eyes: Conjunctivae clear. Sclera anicteric. Head: Normocephalic. Atraumatic. Nose: No congestion. No rhinorrhea. Mouth/Throat: Mucous membranes are moist.  Neck: No stridor.   Cardiovascular: Normal rate, regular rhythm. No murmurs. Extremities well perfused. Respiratory: Normal respiratory effort.   Lungs CTAB. Gastrointestinal: Soft and non-tender. No distention.  Musculoskeletal: No lower extremity edema. Neurologic:  Normal speech and language. No gross focal neurologic deficits are appreciated.  Skin: Skin is warm, dry and intact. No rash noted. Psychiatric: Mood and affect are appropriate for situation. Denies SI, HI, AVH.  EKG  N/A   Radiology  N/A   Procedures  Procedure(s) performed (including critical care):  Procedures   Initial Impression / Assessment and Plan / ED Course  43 y.o. male with history of substance abuse who presents to the ED under IVC, as noted above.   On exam, the patient is calm and cooperative.  He denies any SI, HI, AVH.  Screening labs notable for UDS, positive for cocaine, opiates, and cannabis.  Remainder of labs without actionable derangements.  Will place psych consult.   Final Clinical Impression(s) / ED Diagnosis  Final diagnoses:  Involuntary commitment      Note:  This document was prepared using Dragon voice recognition software and may include unintentional dictation errors.   Lilia Pro., MD 04/23/19 2337

## 2019-04-23 NOTE — ED Notes (Addendum)
Pt, who goes by his middle name Justin Hicks, does not want information disclosed to his wife. Advised pt that he may be asked if staff/providers can speak with his wife or others for collateral.   Pt arrives to the quad under IVC after he says he texted his wife's friend, April, that he was considering ending things. He says she texted back to ask if he meant suicide, and he responded that he was not suicidal and had no such intentions. April "is one of those co-dependent types, always trying to fix people." He says his phone, available in his belongings, can corroborate such. Pt denies SI, HI, AVH, and paranoia. He says he rarely drinks and last did so yesterday after mowing his grass. He says he "used to be a drug addict," for which he is now treated with Suboxone from [place]. He says he does not want his wife finding out that he smoked a blunt yesterday "laced with something." Pt is pleasant and cooperative. No evidence of responding to internal stimuli or aggression witnessed. He was given a sandwich tray, drink, and warm blanket. Pt oriented to quad area. Will continue to monitor for needs/safety.

## 2019-04-23 NOTE — ED Notes (Signed)
Patient pocket knife locked with officer and key 37 in the pyxis.

## 2019-04-23 NOTE — ED Notes (Signed)
Pt given crackers and peanut butter. "They're not just going to grab my phone and go through it? I wouldn't want them to do that." Told him no. Pt remains calm/cooperative. Will continue to monitor for needs/safety.

## 2019-04-23 NOTE — ED Triage Notes (Signed)
Pt arrived via IVC with officer. Per affidavit, pts wife feared pt was harm to self. Pt admits to saying, "Im done with this life," in text message. Pt sts he was referring to the daily arguing with wife. Pt admits to using cocaine. Pt is calm and cooperative. Pt denies SI and HI.

## 2019-04-24 ENCOUNTER — Inpatient Hospital Stay
Admission: EM | Admit: 2019-04-24 | Discharge: 2019-04-27 | DRG: 885 | Disposition: A | Discharge status: Home / Self Care | Payer: Self-pay | Source: Intra-hospital | Attending: Psychiatry | Admitting: Psychiatry

## 2019-04-24 ENCOUNTER — Encounter: Payer: Self-pay | Admitting: Psychiatry

## 2019-04-24 ENCOUNTER — Other Ambulatory Visit: Payer: Self-pay

## 2019-04-24 DIAGNOSIS — F1920 Other psychoactive substance dependence, uncomplicated: Secondary | ICD-10-CM | POA: Diagnosis present

## 2019-04-24 DIAGNOSIS — R45851 Suicidal ideations: Secondary | ICD-10-CM | POA: Diagnosis present

## 2019-04-24 DIAGNOSIS — F332 Major depressive disorder, recurrent severe without psychotic features: Secondary | ICD-10-CM | POA: Diagnosis present

## 2019-04-24 DIAGNOSIS — F4322 Adjustment disorder with anxiety: Secondary | ICD-10-CM | POA: Diagnosis present

## 2019-04-24 DIAGNOSIS — Z20828 Contact with and (suspected) exposure to other viral communicable diseases: Secondary | ICD-10-CM | POA: Diagnosis present

## 2019-04-24 DIAGNOSIS — Z87891 Personal history of nicotine dependence: Secondary | ICD-10-CM

## 2019-04-24 DIAGNOSIS — F1110 Opioid abuse, uncomplicated: Secondary | ICD-10-CM | POA: Diagnosis present

## 2019-04-24 DIAGNOSIS — F329 Major depressive disorder, single episode, unspecified: Secondary | ICD-10-CM | POA: Insufficient documentation

## 2019-04-24 DIAGNOSIS — F1910 Other psychoactive substance abuse, uncomplicated: Secondary | ICD-10-CM

## 2019-04-24 DIAGNOSIS — R45850 Homicidal ideations: Secondary | ICD-10-CM | POA: Diagnosis present

## 2019-04-24 LAB — SARS CORONAVIRUS 2 BY RT PCR (HOSPITAL ORDER, PERFORMED IN ~~LOC~~ HOSPITAL LAB): SARS Coronavirus 2: NEGATIVE

## 2019-04-24 MED ORDER — ACETAMINOPHEN 325 MG PO TABS: 650.0000 mg | ORAL_TABLET | Freq: Four times a day (QID) | ORAL | Status: DC | PRN

## 2019-04-24 MED ORDER — MAGNESIUM HYDROXIDE 400 MG/5ML PO SUSP: 30.0000 mL | Freq: Every day | ORAL | Status: DC | PRN

## 2019-04-24 MED ORDER — GABAPENTIN 100 MG PO CAPS
100.0000 mg | ORAL_CAPSULE | Freq: Three times a day (TID) | ORAL | Status: DC
  Filled 2019-04-24 (×2): qty 1

## 2019-04-24 MED ORDER — ALUM & MAG HYDROXIDE-SIMETH 200-200-20 MG/5ML PO SUSP: 30.0000 mL | ORAL | Status: DC | PRN

## 2019-04-24 MED ORDER — TRAZODONE HCL 50 MG PO TABS: 50.0000 mg | ORAL_TABLET | Freq: Every evening | ORAL | Status: DC | PRN

## 2019-04-24 MED ORDER — TRAZODONE HCL 50 MG PO TABS
50.0000 mg | ORAL_TABLET | Freq: Every evening | ORAL | Status: DC | PRN
  Administered 2019-04-26: 21:00:00 50 mg via ORAL
  Filled 2019-04-24: qty 1

## 2019-04-24 MED ORDER — GABAPENTIN 100 MG PO CAPS
100.0000 mg | ORAL_CAPSULE | Freq: Three times a day (TID) | ORAL | Status: DC
Start: 2019-04-24 — End: 2019-04-24

## 2019-04-24 NOTE — Tx Team (Signed)
Initial Treatment Plan 04/24/2019 4:03 PM Justin Hicks. [id number]    PATIENT STRESSORS: Marital or family conflict Substance abuse Other: suicidal/homicidal ideations   PATIENT STRENGTHS: Ability for insight Capable of independent living Child psychotherapist Physical Health Supportive family/friends Work skills   PATIENT IDENTIFIED PROBLEMS: Suicidal ideations 04/24/19  Homicidal ideations 04/24/19  Substance abuse 04/24/19  Marital and family conflict 44/92/01               DISCHARGE CRITERIA:  Improved stabilization in mood, thinking, and/or behavior Motivation to continue treatment in a less acute level of care Need for constant or close observation no longer present  PRELIMINARY DISCHARGE PLAN: Outpatient therapy Participate in family therapy Return to previous living arrangement Return to previous work or school arrangements  PATIENT/FAMILY INVOLVEMENT: This treatment plan has been presented to and reviewed with the patient, Justin Hicks. The patient has been given the opportunity to ask questions and make suggestions.  Reyes Ivan, RN 04/24/2019, 4:03 PM

## 2019-04-24 NOTE — ED Notes (Signed)
Pt became irate upon learning that he was to stay overnight. "That bitch is keeping me here tonight!" Pt indicated he felt misled and said staying here would cost him $700 due to missed work in the morning. Pt said he needed to smoke; declined an offer of a nicotine patch. Pt was moved to 20 and de-escalated verbally. He is now watching TV and has verbalized that he will be more calm. Security staff outside room. Will continue to monitor for needs/safety.

## 2019-04-24 NOTE — ED Notes (Signed)
Pt provided this Probation officer with his wife's phone number for PMHNP to gain collateral: Jemiah Ellen[place], [phone].

## 2019-04-24 NOTE — ED Notes (Signed)
NP speaking with patient at this time. Patient is upset he has ben told he will be admitted to the hospital. Maintained on 15 minute checks.

## 2019-04-24 NOTE — ED Notes (Signed)
Pt was asking what hours his wife could come to visit. Staff explained he would be on the inpatient unit at that time.  *Phone call with his wife ended with yelling and cursing.
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
## 2019-04-24 NOTE — Progress Notes (Addendum)
D: Received patient from [facility] Emergency Department. Patient skin assessment completed with [name], RN, skin is intact, no contraband found with all unit prohibited items locked and stored away for discharge. Pt. Was admitted under the services of, Dr. Weber Cooks.   Pt. During admissions processing is mostly pleasant and cooperative, but has multiple questions about his involuntary commitment and when he can see the doctor. Pt. Also clearly irritable and agitated regarding his, "wife". Pt. Denies si/hi/avh, able to contract for safety. Pt. Endorses he has been having martial problems and his wife has IVC'd him, because she found out he has been planning to leave her.   A: Patient oriented to unit/room/call light. Pt. Given extensive admissions education. Patient was encourage to participate in unit activities and continue with plan of care being put into place while he is here. Q x 15 minute observation checks were initiated for safety. Pt. Given dinner to eat.   R: Patient is not receptive to initial treatment plan  being put into place and safety to be maintained on unit per MD orders.

## 2019-04-24 NOTE — ED Notes (Signed)
Pt discharged to BMU under IVC. Pt accepting of disposition. All belongings will be sent with patient. Report given to Coralyn Mark, Therapist, sports.

## 2019-04-24 NOTE — ED Notes (Addendum)
RN informed patient he will be admitted to the [address]. Pt is insisting he is leaving. NP made aware. Maintained on 15 minute checks.

## 2019-04-24 NOTE — ED Notes (Signed)
A friend of pt's wife spoke with ED secretary and expressed that if pt is discharged he will kill himself and the wife both. The friend wanted to speak with psych and/or EDP; she began Pensions consultant at some point. Notified PMHNP. Will continue to monitor for needs/safety.
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
## 2019-04-24 NOTE — Consult Note (Addendum)
[facility] Face-to-Face Psychiatry Consult   Reason for Consult: Suicidal ideation Referring Physician: Dr. Colon BranchMonks Patient Identification: Justin DoomsJames Marshall Alejo Jr. MRN:  [id number] Principal Diagnosis: Substance abuse (HCC) Diagnosis:  Principal Problem:   Substance abuse (HCC) Active Problems:   MDD (major depressive disorder)   Total Time spent with patient: 1.5 hours  Subjective: "I am not suicidal.  I do not want to hurt nobody.  I want to go home." Justin DoomsJames Marshall Minner Jr. is a 43 y.o. male patient [facility] ED via law enforcement under involuntary commitment status (IVC). "My wife does these kinds of things to me.  I am not suicidal, and I do not want to hurt nobody.  All I want to do is go home."  The patient provided collateral for this provider to contact with Mr. Justin Hicks [phone] per the patient, "he is like a dad to me."   The patient was assessed; he discussed his wife taking out IVC paperwork on him is all a misunderstanding. The patient stated he text his wife's friend, saying he is done with all of this, and he does not want to be a part of it anymore.  He explained he was talking about his relationship with his wife.  He stated, "my wife is full of drama, and she loves to keep things stirred up."  He discussed that his wife has a friend who makes everyone's business hers. He stated, "she thinks it is her job to fix everyone, but she is the one who needs fixing." The patient says that he is not that type of person to want to hurt himself.  He voice, he is mostly a lay back type of person, like things quiet and does not want to be involved with a lot of arguments and fights.  So he explained this night (Thursday) he had a plan to leave his wife.  He stated he had called her asking her if he can come and get his clothes and other items he has at their house.  He said while he was on his way to get these things, she had gone to the [employer] office and gotten IVC  paperwork out on him.  The patient stated he has to work in the morning.  His wife disputed that argument saying, "he has not worked in a year."  She disclosed she has been taking care of him the entire year.  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  The patient UDS is positive for cocaine, opioids, and cannabinoid.  The patient became irate when it was discussed that it would be better for all parties involved that he remain overnight. He will be reassessed in the morning.  At that time, the decision as to him being discharged or staying in the hospital will be made.  The patient was threatening to walk out of the ED.  He was redirected and was assigned a room in which he has remained calm and resting.  Collateral was obtained by [name], which is the person that the patient asked to be called.  Speaking to [name] he voiced that he did not think the patient was a danger to himself or anyone else.  He did note that the patient and his wife have lots of marital problems, which the patient has brought it to his attention.  He did disclose that the patient is a substance abuser, and he does not always do the "right things."  [name] explained that he has been knowing the patient since he was 43 years old.  He discloses that he does not know him to have ever harm anyone or attempted to harm himself. He explained that the patient is trying to get his life together and, at times, does not always do the right things, but he stated Mr. Justin Hicks is a quiet person who tries to get away from violence.  This information was presented to Dr. Lucianne MussKumar, who guided this provider to contact the patient wife, Justin Hicks [phone].  In speaking with Ms. Justin Hicks, she voiced the patient is a danger to himself and her.  She stated, "my husband is a drug addict, and he needs  help."  She expressed, "he threatened to kill himself tonight."  She stated, "he text his daughter and said he was going to kill himself, and she texted me the message."  She continued to voice, "he is a chronic substance abuser with a bipolar diagnosis, and he needs to be admitted downstairs."  She called to relate that her husband had texted her and threatened he is going to get discharge tonight, and when he gets out, he is coming to kill her and kill himself.  She stated the text took place at1251 a.m. when the patient did not have access to his cell phone due to the policy that all patient belongings are confiscated when they are brought back to the ED.  The friend of the patient's wife contacted the ED secretary berating her stating if the patient gets discharged, she is going to bring legal action against the hospital, this provider, and everyone else involved.  The patient was seen face-to-face by this provider; chart reviewed and consulted with Dr. Lucianne MussKumar and Dr. York CeriseForbach on 04/24/2019 due to the care of the patient. It was discussed with the EDP as to the patient domestic situation with his wife and him voicing that he is not suicidal, having him remain on observation overnight and reassessed in the a.m. The patient is alert and oriented x4, agitated but cooperative, and mood-congruent with affect. The patient does not appear to be responding to internal or external stimuli. Neither is the patient presenting with any delusional thinking. The patient denies auditory or visual hallucinations. The patient denies suicidal, homicidal, or self-harm ideations. The patient is not presenting with any psychotic or paranoid behaviors. During an encounter with the patient, he was able to answer questions appropriately.  Plan: The patient collateral information needs to be investigated more closely due to him saying he is not SI/HI, and his wife is stating the opposite of that. He is positive for cocaine,  cannabinoid, and benzodiazepine, which he needs to be observed overnight and reassess in the a.m. to determine if he meets the criteria for admission or discharge.  HPI: Per Dr. Colon BranchMonks; Lafayette DragonJames Marshall Markus DaftRobertson Jr. is a 43 y.o. male with history of substance abuse who presents under an IVC.  Per paperwork, the patient's wife pursued IVC after the patient allegedly sent text messages/phone calls implying that he was going to harm himself.  Patient himself denies SI, HI, AVH.  He has no acute complaints, is calm and cooperative.   Past Psychiatric History: History reviewed. No pertinent past medical history.   Risk to Self:  No Risk to Others:  No Prior Inpatient Therapy:  No Prior Outpatient Therapy:  Yes  Past Medical History: History reviewed. No pertinent past medical history.  Past Surgical History:  Procedure Laterality Date  . right leg surgery     Family History:  Family History  Problem Relation Age of Onset  . Cancer Neg Hx   . Diabetes Neg Hx   . Heart failure Neg Hx   . Hyperlipidemia Neg Hx   . Migraines Neg Hx   . Hypertension Neg Hx    Family Psychiatric  History: History reviewed. No pertinent past medical history.  Social History:  Social History   Substance and Sexual Activity  Alcohol Use Yes   Comment: occ     Social History   Substance and Sexual Activity  Drug Use No    Social History   Socioeconomic History  . Marital status: Divorced    Spouse name: Not on file  . Number of children: Not on file  . Years of education: Not on file  . Highest education level: Not on file  Occupational History  . Not on file  Social Needs  . Financial resource strain: Not on file  . Food insecurity    Worry: Not on file    Inability: Not on file  . Transportation needs    Medical: Not on file    Non-medical: Not on file  Tobacco Use  . Smoking status: Former Smoker    Packs/day: 0.00  . Smokeless tobacco: Never Used  Substance and Sexual  Activity  . Alcohol use: Yes    Comment: occ  . Drug use: No  . Sexual activity: Not on file  Lifestyle  . Physical activity    Days per week: Not on file    Minutes per session: Not on file  . Stress: Not on file  Relationships  . Social Musician on phone: Not on file    Gets together: Not on file    Attends religious service: Not on file    Active member of club or organization: Not on file    Attends meetings of clubs or organizations: Not on file    Relationship status: Not on file  Other Topics Concern  . Not on file  Social History Narrative  . Not on file   Additional Social History:    Allergies:  No Known Allergies  Labs:  Results for orders placed or performed during the hospital encounter of 04/23/19 (from the past 48 hour(s))  Comprehensive metabolic panel     Status: Abnormal   Collection Time: 04/23/19 10:23 PM  Result Value Ref Range   Sodium 136 135 - 145 mmol/L   Potassium 3.9 3.5 - 5.1 mmol/L   Chloride 101 98 - 111 mmol/L   CO2 24 22 - 32 mmol/L   Glucose, Bld 95 70 - 99 mg/dL   BUN 22 (H) 6 - 20 mg/dL   Creatinine, Ser 1.61 0.61 - 1.24 mg/dL   Calcium 9.1 8.9 - 09.6 mg/dL   Total Protein 7.9 6.5 - 8.1 g/dL   Albumin 5.0 3.5 - 5.0 g/dL   AST 26 15 - 41 U/L   ALT 22 0 - 44 U/L   Alkaline Phosphatase 64 38 - 126 U/L   Total Bilirubin 0.7 0.3 - 1.2 mg/dL   GFR calc non Af Amer >60 >60 mL/min   GFR calc Af Amer >60 >60 mL/min   Anion gap 11 5 - 15    Comment: Performed at [facility], [address]  [place], [place] [address]  Ethanol     Status: None   Collection Time: 04/23/19 10:23 PM  Result Value Ref Range   Alcohol, Ethyl (B) <10 <10 mg/dL    Comment: (NOTE) Lowest detectable limit for serum alcohol is 10 mg/dL. For medical purposes only. Performed at [facility], [address] [place], [place] [address]   Salicylate level     Status: None   Collection Time: 04/23/19 10:23 PM  Result Value Ref  Range   Salicylate Lvl <7.0 2.8 - 30.0 mg/dL    Comment: Performed at [facility], [address] [place], [place] [address]  Acetaminophen level     Status: Abnormal   Collection Time: 04/23/19 10:23 PM  Result Value Ref Range   Acetaminophen (Tylenol), Serum <10 (L) 10 - 30 ug/mL    Comment: (NOTE) Therapeutic concentrations vary significantly. A range of 10-30 ug/mL  may be an effective concentration for many patients. However, some  are best treated at concentrations outside of this range. Acetaminophen concentrations >150 ug/mL at 4 hours after ingestion  and >50 ug/mL at 12 hours after ingestion are often associated with  toxic reactions. Performed at [facility], [address] [place], [place] [address]   cbc     Status: Abnormal   Collection Time: 04/23/19 10:23 PM  Result Value Ref Range   WBC 12.3 (H) 4.0 - 10.5 K/uL   RBC 4.43 4.22 - 5.81 MIL/uL   Hemoglobin 14.3 13.0 - 17.0 g/dL   HCT 24.441.5 01.039.0 - 27.252.0 %   MCV 93.7 80.0 - 100.0 fL   MCH 32.3 26.0 - 34.0 pg   MCHC 34.5 30.0 - 36.0 g/dL   RDW 53.612.7 64.411.5 - 03.415.5 %   Platelets 342 150 - 400 K/uL   nRBC 0.0 0.0 - 0.2 %    Comment: Performed at [facility], [address] [place], [place] [address]  Urine Drug Screen, Qualitative     Status: Abnormal   Collection Time: 04/23/19 10:23 PM  Result Value Ref Range   Tricyclic, Ur Screen NONE DETECTED NONE DETECTED   Amphetamines, Ur Screen NONE DETECTED NONE DETECTED   MDMA (Ecstasy)Ur Screen NONE DETECTED NONE DETECTED   Cocaine Metabolite,Ur [place] POSITIVE (A) NONE DETECTED   Opiate, Ur Screen POSITIVE (A) NONE DETECTED   Phencyclidine (PCP) Ur S NONE DETECTED NONE DETECTED   Cannabinoid 50 Ng, Ur [place] POSITIVE (A) NONE DETECTED   Barbiturates, Ur Screen NONE DETECTED NONE DETECTED   Benzodiazepine, Ur Scrn NONE DETECTED NONE DETECTED   Methadone Scn, Ur NONE DETECTED NONE DETECTED    Comment: (NOTE) Tricyclics + metabolites, urine     Cutoff 1000 ng/mL Amphetamines + metabolites, urine  Cutoff 1000 ng/mL MDMA (Ecstasy), urine              Cutoff 500 ng/mL Cocaine Metabolite, urine          Cutoff 300 ng/mL Opiate + metabolites, urine        Cutoff 300 ng/mL Phencyclidine (PCP), urine         Cutoff 25 ng/mL Cannabinoid, urine                 Cutoff 50 ng/mL Barbiturates + metabolites, urine  Cutoff 200 ng/mL Benzodiazepine, urine              Cutoff 200 ng/mL Methadone, urine  Cutoff 300 ng/mL The urine drug screen provides only a preliminary, unconfirmed analytical test result and should not be used for non-medical purposes. Clinical consideration and professional judgment should be applied to any positive drug screen result due to possible interfering substances. A more specific alternate chemical method must be used in order to obtain a confirmed analytical result. Gas chromatography / mass spectrometry (GC/MS) is the preferred confirmat ory method. Performed at [facility], [address] [place], [place] [address]   SARS Coronavirus 2 [facility] order, Performed in [facility] hospital lab) Nasopharyngeal Nasopharyngeal Swab     Status: None   Collection Time: 04/23/19 11:18 PM   Specimen: Nasopharyngeal Swab  Result Value Ref Range   SARS Coronavirus 2 NEGATIVE NEGATIVE    Comment: (NOTE) If result is NEGATIVE SARS-CoV-2 target nucleic acids are NOT DETECTED. The SARS-CoV-2 RNA is generally detectable in upper and lower  respiratory specimens during the acute phase of infection. The lowest  concentration of SARS-CoV-2 viral copies this assay can detect is 250  copies / mL. A negative result does not preclude SARS-CoV-2 infection  and should not be used as the sole basis for treatment or other  patient management decisions.  A negative result may occur with  improper specimen collection / handling, submission of specimen other  than nasopharyngeal swab, presence of viral  mutation(s) within the  areas targeted by this assay, and inadequate number of viral copies  (<250 copies / mL). A negative result must be combined with clinical  observations, patient history, and epidemiological information. If result is POSITIVE SARS-CoV-2 target nucleic acids are DETECTED. The SARS-CoV-2 RNA is generally detectable in upper and lower  respiratory specimens dur ing the acute phase of infection.  Positive  results are indicative of active infection with SARS-CoV-2.  Clinical  correlation with patient history and other diagnostic information is  necessary to determine patient infection status.  Positive results do  not rule out bacterial infection or co-infection with other viruses. If result is PRESUMPTIVE POSTIVE SARS-CoV-2 nucleic acids MAY BE PRESENT.   A presumptive positive result was obtained on the submitted specimen  and confirmed on repeat testing.  While 2019 novel coronavirus  (SARS-CoV-2) nucleic acids may be present in the submitted sample  additional confirmatory testing may be necessary for epidemiological  and / or clinical management purposes  to differentiate between  SARS-CoV-2 and other Sarbecovirus currently known to infect humans.  If clinically indicated additional testing with an alternate test  methodology [phone]) is advised. The SARS-CoV-2 RNA is generally  detectable in upper and lower respiratory sp ecimens during the acute  phase of infection. The expected result is Negative. Fact Sheet for Patients:  StrictlyIdeas.no Fact Sheet for Healthcare Providers: BankingDealers.co.za This test is not yet approved or cleared by the [place] FDA and has been authorized for detection and/or diagnosis of SARS-CoV-2 by FDA under an Emergency Use Authorization (EUA).  This EUA will remain in effect (meaning this test can be used) for the duration of the COVID-19 declaration under Section 564(b)(1)  of the Act, 21 U.S.C. section 360bbb-3(b)(1), unless the authorization is terminated or revoked sooner. Performed at [facility], [address] [place], [place] [address]     No current facility-administered medications for this encounter.    Current Outpatient Medications  Medication Sig Dispense Refill  . clindamycin (CLEOCIN) 300 MG capsule Take 1 capsule (300 mg total) by mouth 3 (three) times daily. 21 capsule 0  . HYDROcodone-acetaminophen (  NORCO/VICODIN) 5-325 MG per tablet Take 1 tablet by mouth every 6 (six) hours as needed for moderate pain. 10 tablet 0  . ibuprofen (ADVIL,MOTRIN) 800 MG tablet Take 1 tablet (800 mg total) by mouth every 8 (eight) hours as needed for moderate pain. 15 tablet 0  . ibuprofen (ADVIL,MOTRIN) 800 MG tablet Take 1 tablet (800 mg total) by mouth every 8 (eight) hours as needed. 30 tablet 0  . lidocaine (XYLOCAINE) 2 % solution Use as directed 5 mLs in the mouth or throat every 6 (six) hours as needed for mouth pain. 100 mL 0  . oxyCODONE-acetaminophen (PERCOCET) 7.5-325 MG tablet Take 1 tablet by mouth every 6 (six) hours as needed for severe pain. 12 tablet 0  . oxyCODONE-acetaminophen (ROXICET) 5-325 MG tablet Take 1 tablet by mouth every 6 (six) hours as needed for severe pain. 16 tablet 0    Musculoskeletal: Strength & Muscle Tone: within normal limits Gait & Station: normal Patient leans: N/A  Psychiatric Specialty Exam: Physical Exam  Nursing note and vitals reviewed. Constitutional: He appears well-developed and well-nourished.  HENT:  Head: Normocephalic.  Eyes: Pupils are equal, round, and reactive to light.  Neck: Normal range of motion. Neck supple.  Cardiovascular: Normal rate.  Respiratory: Effort normal.  Musculoskeletal: Normal range of motion.  Neurological: He is alert.  Skin: Skin is warm and dry.    Review of Systems  Psychiatric/Behavioral: Positive for depression and substance abuse. The patient is  nervous/anxious.     Blood pressure 140/83, pulse 77, temperature 99 F (37.2 C), temperature source Oral, resp. rate 17, SpO2 99 %.There is no height or weight on file to calculate BMI.  General Appearance: Casual  Eye Contact:  Good  Speech:  Clear and Coherent  Volume:  Increased  Mood:  Angry, Anxious, Depressed and Irritable  Affect:  Congruent, Constricted and Depressed  Thought Process:  Coherent  Orientation:  Full (Time, Place, and Person)  Thought Content:  Logical  Suicidal Thoughts:  No  Homicidal Thoughts:  No  Memory:  Immediate;   Good Recent;   Good Remote;   Good  Judgement:  Fair  Insight:  Fair  Psychomotor Activity:  Normal  Concentration:  Concentration: Fair and Attention Span: Fair  Recall:  [employer] of Knowledge:  Good  Language:  Good  Akathisia:  Negative  Handed:  Right  AIMS (if indicated):     Assets:  Communication Skills Desire for Improvement Social Support  ADL's:  Intact  Cognition:  WNL  Sleep:   Terrible      Treatment Plan Summary: Daily contact with patient to assess and evaluate symptoms and progress in treatment  Disposition: The patient will be observed overnight and reassess in the a.m.  Gillermo Murdoch, NP 04/24/2019 3:19 AM

## 2019-04-24 NOTE — ED Notes (Signed)
Pt talking with PMHNP in interview room. Security present.

## 2019-04-24 NOTE — BH Assessment (Signed)
Patient is to be admitted to [facility] by Psych Nurse Practitioner, Waylan Boga  Attending Physician will be Dr. Weber Cooks.   Patient has been assigned to room 320, by [place].   ER staff is aware of the admission:  Nitcha, ER Secretary    Dr. Jacqualine Code, ER MD   Amy B., Patient's Nurse   Vonna Kotyk, Patient Access.
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
## 2019-04-24 NOTE — ED Notes (Signed)
Pt given meal tray but continues to sleep. 

## 2019-04-24 NOTE — Plan of Care (Signed)
Patient just recently admitted to the unit. Patient has not had sufficient time to show progressions at this time. Will continue to monitor for progressions.   Problem: Education: Goal: Emotional status will improve Outcome: Not Progressing Goal: Mental status will improve Outcome: Not Progressing   Problem: Health Behavior/Discharge Planning: Goal: Compliance with treatment plan for underlying cause of condition will improve Outcome: Not Progressing   Problem: Health Behavior/Discharge Planning: Goal: Ability to remain free from injury will improve Outcome: Not Progressing   Problem: Self-Concept: Goal: Ability to disclose and discuss suicidal ideas will improve Outcome: Not Progressing Goal: Will verbalize positive feelings about self Outcome: Not Progressing

## 2019-04-25 DIAGNOSIS — F332 Major depressive disorder, recurrent severe without psychotic features: Principal | ICD-10-CM
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
## 2019-04-25 NOTE — Progress Notes (Signed)
D: Patient stated slept good last night .Stated appetite  good and energy level  normal. Stated concentration  good . Stated on Depression scale 0 , hopeless 0 and anxiety 0 .( low 0-10 high).  No auditory hallucinations  No pain concerns . Appropriate ADL'S. Interacting with peers and staff. Emotional and mental status improved. Voice of no safety concerns . Patient denies  suicidal ideations . Encourage patient to speak positive  thoughts  of self. Patient isolates to room  Refused to take Neurontin this shift . Patient only goal he expressed  " Be with my family"  A: Encourage patient participation with unit programming . Instruction  Given on  Medication , verbalize understanding.  R: Voice no other concerns. Staff continue to monitor
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
## 2019-04-25 NOTE — Plan of Care (Signed)
  Problem: Education: Goal: Emotional status will improve Outcome: Progressing Goal: Mental status will improve Outcome: Progressing   Problem: Health Behavior/Discharge Planning: Goal: Compliance with treatment plan for underlying cause of condition will improve Outcome: Progressing   Problem: Health Behavior/Discharge Planning: Goal: Ability to remain free from injury will improve Outcome: Progressing   Problem: Self-Concept: Goal: Ability to disclose and discuss suicidal ideas will improve Outcome: Progressing Goal: Will verbalize positive feelings about self Outcome: Progressing

## 2019-04-25 NOTE — BHH Group Notes (Signed)
LCSW Group Therapy Note   04/25/2019 1:15pm   Type of Therapy and Topic:  Group Therapy:  Trust and Honesty  Participation Level:  Did Not Attend  Description of Group:    In this group patients will be asked to explore the value of being honest.  Patients will be guided to discuss their thoughts, feelings, and behaviors related to honesty and trusting in others. Patients will process together how trust and honesty relate to forming relationships with peers, family members, and self. Each patient will be challenged to identify and express feelings of being vulnerable. Patients will discuss reasons why people are dishonest and identify alternative outcomes if one was truthful (to self or others). This group will be process-oriented, with patients participating in exploration of their own experiences, giving and receiving support, and processing challenge from other group members.   Therapeutic Goals: 1. Patient will identify why honesty is important to relationships and how honesty overall affects relationships.  2. Patient will identify a situation where they lied or were lied too and the  feelings, thought process, and behaviors surrounding the situation 3. Patient will identify the meaning of being vulnerable, how that feels, and how that correlates to being honest with self and others. 4. Patient will identify situations where they could have told the truth, but instead lied and explain reasons of dishonesty.   Summary of Patient Progress Pt was invited to attend group but chose not to attend. CSW will continue to encourage pt to attend group throughout their admission.     Therapeutic Modalities:   Cognitive Behavioral Therapy Solution Focused Therapy Motivational Interviewing Brief Therapy  Jennessa Trigo  CUEBAS-COLON, LCSW 04/25/2019 12:41 PM  

## 2019-04-25 NOTE — Progress Notes (Signed)
Patient is calm and cooperating care regimen, adjusting well in the unit, has voiced no concerns , took his medications as prescribed, denies any SI/HI/AVH  At this time , safety and expected behaviors are discussed , education is provided, support and encouragement is provided, patient understood information provided and acknowledged, no distress. 15 minutes safety checks is maintained .

## 2019-04-25 NOTE — Plan of Care (Signed)
Emotional and mental status improved. Voice of no safety concerns . Patient denies  suicidal ideations . Encourage patient to speak positive  thoughts  of self.   Problem: Self-Concept: Goal: Ability to disclose and discuss suicidal ideas will improve Outcome: Progressing Goal: Will verbalize positive feelings about self Outcome: Progressing   Problem: Health Behavior/Discharge Planning: Goal: Ability to remain free from injury will improve Outcome: Progressing   Problem: Health Behavior/Discharge Planning: Goal: Compliance with treatment plan for underlying cause of condition will improve Outcome: Progressing   Problem: Education: Goal: Emotional status will improve Outcome: Progressing Goal: Mental status will improve Outcome: Progressing

## 2019-04-26 NOTE — BHH Counselor (Signed)
Adult Comprehensive Assessment  Patient ID: Justin Shellhammer., male   DOB: 03/21/1976, 43 y.o.   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 Housing / Lack of housing: stable Physical health (include injuries & life threatening diseases): none reported Social relationships: good Substance abuse: Cocaine THC Bereavement / Loss: none reported  Living/Environment/Situation:  Living Arrangements: Spouse/significant other Who else lives in the home?: SPOUSE How long has patient lived in current situation?: 6 YEARS What is atmosphere in current home: Comfortable  Family History:  Marital status: Married Number of Years Married: 1 Are you sexually active?: Yes What is your sexual orientation?: HETEROSEXUAL Has your sexual activity been affected by drugs, alcohol, medication, or emotional stress?: none reported Does patient have children?: Yes How many children?: 4 How is patient's relationship with their children?: good  Childhood History:  By whom was/is the patient raised?: Father Additional childhood history information: "mom was never in my life" Description of patient's relationship with caregiver when they were a child: good Patient's description of current relationship with people who raised him/her: good How were you disciplined when you got in trouble as a child/adolescent?: okay Does patient have siblings?: Yes Number of Siblings: 4 Description of patient's current relationship with siblings: good Did patient suffer any  verbal/emotional/physical/sexual abuse as a child?: No Did patient suffer from severe childhood neglect?: No Has patient ever been sexually abused/assaulted/raped as an adolescent or adult?: No Was the patient ever a victim of a crime or a disaster?: No Witnessed domestic violence?: No Has patient been effected by domestic violence as an adult?: No  Education:  Highest grade of school patient has completed: 2 college degrees Currently a Ship broker?: No Learning disability?: No  Employment/Work Situation:   Employment situation: Employed 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 How long has patient been employed?: 6 years Patient's job has been impacted by current illness: No What is the longest time patient has a held a job?: 15 years 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 Did You Receive Any Psychiatric Treatment/Services While in the [employer]?: No Are There Guns or Other Weapons in [place]?: No  Financial Resources:   Financial resources: Income from employment Does patient have a representative payee or guardian?: No  Alcohol/Substance Abuse:   What has been your use of drugs/alcohol within the last 12 months?: cocaine and cannabis- did not specify amount or frquency If attempted suicide, did drugs/alcohol play a role in this?: No Alcohol/Substance Abuse Treatment Hx: Past Tx, Inpatient, Past Tx, Outpatient, Attends AA/NA Has alcohol/substance abuse ever caused legal problems?: Yes  Social Support System:   Patient's Community Support System: Fair Describe Community Support System: family Type of faith/religion: Engineer, manufacturing  Leisure/Recreation:   Leisure and Hobbies: work  Strengths/Needs:   What is the patient's perception of their strengths?: determination and self-discipline Patient states these barriers may affect/interfere with their treatment: none reported Patient states these barriers may affect their return to the community: none  reported  Discharge Plan:   Currently receiving community mental health services: Yes (From Whom)([facility]) Patient states concerns and preferences for aftercare planning are: Pt reports he will return to [facility]  for op services Patient states they will know when they are safe and ready for discharge when: "I feel fine" Does patient have access to transportation?: Yes Does patient have financial barriers related to discharge medications?: No Will patient be returning to same living situation after discharge?: Yes  Summary/Recommendations:   Summary and Recommendations (to be completed by the evaluator): Patient is a 43 year old male admitted involuntarily and diagnosed with Major Depressive Disorder.  Patient with a past history of substance use disorder, [facility] to reported suicidal and homicidal threats. Patient will benefit from crisis stabilization, medication evaluation, group therapy and psychoeducation. In addition to case management for discharge planning. At discharge it is recommended that patient adhere to the established discharge plan and continue treatment.  Justin Hicks  CUEBAS-COLON. 04/26/2019

## 2019-04-26 NOTE — Progress Notes (Signed)
Patient refused his morning medication stating to this writer "I don't think I need it". This Probation officer will notify MD.
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
## 2019-04-26 NOTE — Plan of Care (Signed)
D- Patient alert and oriented. Patient presents in a pleasant mood on assessment stating that he slept good last night and had no complaints or concerns to voice. Patient denies SI, HI, AVH, and pain at this time. Patient also denies any signs/symptoms of depression/anxiety to this writer stating "I feel fine, just ready to go home". Patient has been present in the milieu, attending/participating in unit groups and his goal for today is to "go home".  A- Support and encouragement provided.  Routine safety checks conducted every 15 minutes.  Patient informed to notify staff with problems or concerns.  R- Patient contracts for safety at this time. Patient compliant with treatment plan. Patient receptive, calm, and cooperative. Patient interacts well with others on the unit.  Patient remains safe at this time.  Problem: Education: Goal: Emotional status will improve Outcome: Progressing Goal: Mental status will improve Outcome: Progressing   Problem: Health Behavior/Discharge Planning: Goal: Compliance with treatment plan for underlying cause of condition will improve Outcome: Progressing   Problem: Health Behavior/Discharge Planning: Goal: Ability to remain free from injury will improve Outcome: Progressing   Problem: Self-Concept: Goal: Ability to disclose and discuss suicidal ideas will improve Outcome: Progressing Goal: Will verbalize positive feelings about self Outcome: Progressing

## 2019-04-26 NOTE — BHH Group Notes (Signed)
LCSW Group Therapy Note 04/26/2019 1:15pm  Type of Therapy and Topic: Group Therapy: Feelings Around Returning Home & Establishing a Supportive Framework and Supporting Oneself When Supports Not Available  Participation Level: Active  Description of Group:  Patients first processed thoughts and feelings about upcoming discharge. These included fears of upcoming changes, lack of change, new living environments, judgements and expectations from others and overall stigma of mental health issues. The group then discussed the definition of a supportive framework, what that looks and feels like, and how do to discern it from an unhealthy non-supportive network. The group identified different types of supports as well as what to do when your family/friends are less than helpful or unavailable  Therapeutic Goals  1. Patient will identify one healthy supportive network that they can use at discharge. 2. Patient will identify one factor of a supportive framework and how to tell it from an unhealthy network. 3. Patient able to identify one coping skill to use when they do not have positive supports from others. 4. Patient will demonstrate ability to communicate their needs through discussion and/or role plays.  Summary of Patient Progress:  The patient reported he feels "fine." Pt engaged during group session. As patients processed their anxiety about discharge and described healthy supports patient shared he is ready to be discharge.  Patients identified at least one self-care tool they were willing to use after discharge.   Therapeutic Modalities Cognitive Behavioral Therapy Motivational Interviewing   Cheree Ditto, LCSW 04/26/2019 12:35 PM

## 2019-04-26 NOTE — Plan of Care (Signed)
  Problem: Education: Goal: Emotional status will improve Outcome: Progressing Goal: Mental status will improve Outcome: Progressing   Problem: Health Behavior/Discharge Planning: Goal: Compliance with treatment plan for underlying cause of condition will improve Outcome: Progressing   Problem: Health Behavior/Discharge Planning: Goal: Ability to remain free from injury will improve Outcome: Progressing   Problem: Self-Concept: Goal: Ability to disclose and discuss suicidal ideas will improve Outcome: Progressing Goal: Will verbalize positive feelings about self Outcome: Progressing   

## 2019-04-27 DIAGNOSIS — F4325 Adjustment disorder with mixed disturbance of emotions and conduct: Secondary | ICD-10-CM

## 2019-04-27 NOTE — Progress Notes (Signed)
Patient alert and oriented x 4, affect is blunted, he denies SI/HI/AVH, interacting appropriately with peers and staff, no distress noted, patient hoping to get discharge today. Patient's thought are organized, speech is soft non pressured, no bizarre behavior, complaint with scheduled medication regimen.15 minutes safety checks maintained will continue to monitor closely.

## 2019-04-27 NOTE — Plan of Care (Signed)
  Problem: Self-Concept: Goal: Ability to disclose and discuss suicidal ideas will improve Outcome: Progressing  Patient denies suicidal ideation.  

## 2019-04-27 NOTE — [facility] Suicide Risk Assessment (Signed)
[place] INPATIENT:  Family/Significant Other Suicide Prevention Education  Suicide Prevention Education:  Education Completed; Justin Hicks/ [phone] has been identified by the patient as the family member/significant other with whom the patient will be residing, and identified as the person(s) who will aid the patient in the event of a mental health crisis (suicidal ideations/suicide attempt).  With written consent from the patient, the family member/significant other has been provided the following suicide prevention education, prior to the and/or following the discharge of the patient.  The suicide prevention education provided includes the following:  Suicide risk factors  Suicide prevention and interventions  National Suicide Hotline telephone number  [facility] assessment telephone number  [facility] Emergency Assistance [place] and/or Residential Mobile Crisis Unit telephone number  Request made of family/significant other to:  Remove weapons (e.g., guns, rifles, knives), all items previously/currently identified as safety concern.    Remove drugs/medications (over-the-counter, prescriptions, illicit drugs), all items previously/currently identified as a safety concern.  The family member/significant other verbalizes understanding of the suicide prevention education information provided.  The family member/significant other agrees to remove the items of safety concern listed above.  CSW spoke with pts wife who reported that pt is in the hospital due to substance abuse. Justin Kluver denied any concerns for SI/HI or with pt returning home. Tine stated there are no guns/weapons in the home. Justin Kluver reported that she will pick pt up today at 10:30 for discharge.  [place] MSW LCSW 04/27/2019, 9:48 AM

## 2019-04-27 NOTE — Progress Notes (Signed)
Recreation Therapy Notes   Date: 04/27/2019  Time: 9:30 am   Location: Craft room   Behavioral response: N/A   Intervention Topic: Time-Management  Discussion/Intervention: Patient did not attend group.   Clinical Observations/Feedback:  Patient did not attend group.   Lynise Porr LRT/CTRS        Sharetha Newson 04/27/2019 10:34 AM 

## 2019-04-27 NOTE — [facility] Suicide Risk Assessment (Signed)
[facility] Discharge Suicide Risk Assessment   Principal Problem: <principal problem not specified> Discharge Diagnoses: Active Problems:   Major depressive disorder, recurrent severe without psychotic features ([place])   Total Time spent with patient: 45 minutes  Musculoskeletal: Strength & Muscle Tone: within normal limits Gait & Station: normal Patient leans: N/A  Psychiatric Specialty Exam: Review of Systems  Constitutional: Negative.   HENT: Negative.   Eyes: Negative.   Respiratory: Negative.   Cardiovascular: Negative.   Gastrointestinal: Negative.   Musculoskeletal: Negative.   Skin: Negative.   Neurological: Negative.   Psychiatric/Behavioral: Negative.     Blood pressure 129/79, pulse 71, temperature 98.1 F (36.7 C), temperature source Oral, resp. rate 18, height 5\' 11"  (1.803 m), weight 69.9 kg, SpO2 100 %.Body mass index is 21.48 kg/m.  General Appearance: Fairly Groomed  Engineer, water::  Good  Speech:  Clear and [id number]  Volume:  Normal  Mood:  Euthymic  Affect:  Congruent  Thought Process:  Goal Directed  Orientation:  Full (Time, Place, and Person)  Thought Content:  Logical  Suicidal Thoughts:  No  Homicidal Thoughts:  No  Memory:  Immediate;   Fair Recent;   Fair Remote;   Fair  Judgement:  Fair  Insight:  Fair  Psychomotor Activity:  Normal  Concentration:  Fair  Recall:  [employer] of Knowledge:Fair  Language: Fair  Akathisia:  No  Handed:  Right  AIMS (if indicated):     Assets:  Desire for Improvement  Sleep:  Number of Hours: 7.75  Cognition: WNL  ADL's:  Intact   Mental Status Per Nursing Assessment::   On Admission:  NA(denies)  Demographic Factors:  Male and Caucasian  Loss Factors: Financial problems/change in socioeconomic status  Historical Factors: Impulsivity  Risk Reduction Factors:   Living with another person, especially a relative and Positive therapeutic relationship  Continued Clinical Symptoms:   Alcohol/Substance Abuse/Dependencies  Cognitive Features That Contribute To Risk:  None    Suicide Risk:  Minimal: No identifiable suicidal ideation.  Patients presenting with no risk factors but with morbid ruminations; may be classified as minimal risk based on the severity of the depressive symptoms    Plan Of Care/Follow-up recommendations:  Activity:  Activity as tolerated Diet:  Regular diet Other:  Follow-up with outpatient treatment with [facility] as directed  Alethia Berthold, MD 04/27/2019, 9:37 AM

## 2019-04-27 NOTE — Progress Notes (Signed)
Patient ID: Justin Hicks., male   DOB: [birth year], 43 y.o.   MRN: [id number]  Discharge Note:  Patient denies SI/HI/AVH at this time. Discharge instructions, AVS and transition record gone over with patient. Patient agrees to comply with follow-up visit and outpatient therapy. Patient belongings returned to patient. Patient questions and concerns addressed and answered. Patient ambulatory off unit. Patient discharged to home with wife.

## 2019-04-27 NOTE — Discharge Summary (Signed)
Physician Discharge Summary Note  Patient:  Justin DoomsJames Marshall Domagala Jr. is an 43 y.o., male MRN:  [id number] DOB:  1975/12/08 Patient phone:  [phone] (home)  Patient address:   [address] [place] [id number],  Total Time spent with patient: 45 minutes  Date of Admission:  04/24/2019 Date of Discharge: April 27, 2019  Reason for Admission: Admitted through the emergency room where he presented on IVC alleging that he had been voicing suicidal and homicidal ideation and abusing substances  Principal Problem: <principal problem not specified> Discharge Diagnoses: Active Problems:   Major depressive disorder, recurrent severe without psychotic features [facility](HCC)   Past Psychiatric History: Patient has a history of substance abuse and of dropping out of substance abuse treatment  Past Medical History: History reviewed. No pertinent past medical history.  Past Surgical History:  Procedure Laterality Date  . right leg surgery     Family History:  Family History  Problem Relation Age of Onset  . Cancer Neg Hx   . Diabetes Neg Hx   . Heart failure Neg Hx   . Hyperlipidemia Neg Hx   . Migraines Neg Hx   . Hypertension Neg Hx    Family Psychiatric  History: See previous Social History:  Social History   Substance and Sexual Activity  Alcohol Use Yes   Comment: occ     Social History   Substance and Sexual Activity  Drug Use No    Social History   Socioeconomic History  . Marital status: Divorced    Spouse name: Not on file  . Number of children: Not on file  . Years of education: Not on file  . Highest education level: Not on file  Occupational History  . Not on file  Social Needs  . Financial resource strain: Not on file  . Food insecurity    Worry: Not on file    Inability: Not on file  . Transportation needs    Medical: Not on file    Non-medical: Not on file  Tobacco Use  . Smoking status: Former Smoker    Packs/day: 0.00  . Smokeless tobacco:  Never Used  Substance and Sexual Activity  . Alcohol use: Yes    Comment: occ  . Drug use: No  . Sexual activity: Not on file  Lifestyle  . Physical activity    Days per week: Not on file    Minutes per session: Not on file  . Stress: Not on file  Relationships  . Social Musicianconnections    Talks on phone: Not on file    Gets together: Not on file    Attends religious service: Not on file    Active member of club or organization: Not on file    Attends meetings of clubs or organizations: Not on file    Relationship status: Not on file  Other Topics Concern  . Not on file  Social History Narrative  . Not on file    Hospital Course: Patient was admitted to the psychiatric service.  15-minute checks were employed for safety.  Patient showed no dangerous or aggressive behavior.  Did not report any suicidal ideation.  Throughout his hospital stay he maintained that his wife was exaggerating the situation and that he had never.  Suicidal.  He did admit to me that he had relapsed and used marijuana which he thinks had cocaine in it which is his excuse for having cocaine and his drugs screen.  Patient says that he feels  like he needs to get back into treatment.  He spoke with the representative from [facility] and is willing to get back involved in the substance abuse intensive outpatient program.  I called the patient's wife at the time of discharge to advise her that he was being discharged and she was perfectly agreeable and expressed no concern about safety.  She was agreeable to picking him up and has actually come to visit him several times throughout the hospital stay.  No clear indication for any psychiatric medicine.  Patient was denying any symptoms and showed upbeat affect at discharge.  He is to follow-up with outpatient treatment through [facility] and get back into active substance abuse treatment  Physical Findings: AIMS:  , ,  ,  ,    CIWA:    COWS:     Musculoskeletal: Strength & Muscle Tone:  within normal limits Gait & Station: normal Patient leans: N/A  Psychiatric Specialty Exam: Physical Exam  Nursing note and vitals reviewed. Constitutional: He appears well-developed and well-nourished.  HENT:  Head: Normocephalic and atraumatic.  Eyes: Pupils are equal, round, and reactive to light. Conjunctivae are normal.  Neck: Normal range of motion.  Cardiovascular: Regular rhythm and normal heart sounds.  Respiratory: Effort normal. No respiratory distress.  GI: Soft.  Musculoskeletal: Normal range of motion.  Neurological: He is alert.  Skin: Skin is warm and dry.  Psychiatric: He has a normal mood and affect. His speech is normal and behavior is normal. Judgment and thought content normal. Cognition and memory are normal.    Review of Systems  Constitutional: Negative.   HENT: Negative.   Eyes: Negative.   Respiratory: Negative.   Cardiovascular: Negative.   Gastrointestinal: Negative.   Musculoskeletal: Negative.   Skin: Negative.   Neurological: Negative.   Psychiatric/Behavioral: Negative.     Blood pressure 129/79, pulse 71, temperature 98.1 F (36.7 C), temperature source Oral, resp. rate 18, height 5\' 11"  (1.803 m), weight 69.9 kg, SpO2 100 %.Body mass index is 21.48 kg/m.  General Appearance: Casual  Eye Contact:  Good  Speech:  Normal Rate  Volume:  Normal  Mood:  Euthymic  Affect:  Congruent  Thought Process:  Goal Directed  Orientation:  Full (Time, Place, and Person)  Thought Content:  Logical  Suicidal Thoughts:  No  Homicidal Thoughts:  No  Memory:  Immediate;   Fair Recent;   Fair Remote;   Fair  Judgement:  Fair  Insight:  Fair  Psychomotor Activity:  Normal  Concentration:  Concentration: Good  Recall:  Fair  Fund of Knowledge:  Fair  Language:  Fair  Akathisia:  No  Handed:  Right  AIMS (if indicated):     Assets:  Desire for Improvement Housing Physical Health Resilience Social Support  ADL's:  Intact  Cognition:  WNL   Sleep:  Number of Hours: 7.75     Have you used any form of tobacco in the last 30 days? (Cigarettes, Smokeless Tobacco, Cigars, and/or Pipes): No  Has this patient used any form of tobacco in the last 30 days? (Cigarettes, Smokeless Tobacco, Cigars, and/or Pipes) Yes, No  Blood Alcohol level:  Lab Results  Component Value Date   ETH <10 04/23/2019    Metabolic Disorder Labs:  No results found for: HGBA1C, MPG No results found for: PROLACTIN No results found for: CHOL, TRIG, HDL, CHOLHDL, VLDL, LDLCALC  See Psychiatric Specialty Exam and Suicide Risk Assessment completed by Attending Physician prior to discharge.  Discharge destination:  Home  Is patient on multiple antipsychotic therapies at discharge:  No   Has Patient had three or more failed trials of antipsychotic monotherapy by history:  No  Recommended Plan for Multiple Antipsychotic Therapies: NA  Discharge Instructions    Diet - low sodium heart healthy   Complete by: As directed    Increase activity slowly   Complete by: As directed      Allergies as of 04/27/2019   No Known Allergies     Medication List    You have not been prescribed any medications.    Follow-up Information    [facility] Follow up.   Why: Please follow up with Lanae Boast on 04/29/2019 at [place] information: [place] [phone]           Follow-up recommendations:  Activity:  Activity as tolerated Diet:  Regular diet Other:  Follow-up with [facility] and involve yourself deeply in what ever it takes to get sober  Comments: Psychoeducation and counseling done about the dangers of ongoing substance abuse.  Patient agrees to follow-up treatment.  Signed: Alethia Berthold, MD 04/27/2019, 5:09 PM

## 2021-02-28 ENCOUNTER — Encounter: Payer: Self-pay | Admitting: Emergency Medicine

## 2021-02-28 ENCOUNTER — Emergency Department: Payer: Self-pay

## 2021-02-28 ENCOUNTER — Other Ambulatory Visit: Payer: Self-pay

## 2021-02-28 ENCOUNTER — Emergency Department
Admission: EM | Admit: 2021-02-28 | Discharge: 2021-02-28 | Disposition: A | Discharge status: Home / Self Care | Payer: Self-pay | Attending: Emergency Medicine | Admitting: Emergency Medicine

## 2021-02-28 DIAGNOSIS — L03116 Cellulitis of left lower limb: Secondary | ICD-10-CM | POA: Insufficient documentation

## 2021-02-28 DIAGNOSIS — F17210 Nicotine dependence, cigarettes, uncomplicated: Secondary | ICD-10-CM | POA: Insufficient documentation

## 2021-02-28 LAB — BASIC METABOLIC PANEL
Anion gap: 4 — ABNORMAL LOW (ref 5–15)
BUN: 19 mg/dL (ref 6–20)
CO2: 32 mmol/L (ref 22–32)
Calcium: 8.9 mg/dL (ref 8.9–10.3)
Chloride: 99 mmol/L (ref 98–111)
Creatinine, Ser: 0.84 mg/dL (ref 0.61–1.24)
GFR, Estimated: >60 mL/min (ref >60)
Glucose, Bld: 95 mg/dL (ref 70–99)
Potassium: 4.5 mmol/L (ref 3.5–5.1)
Sodium: 135 mmol/L (ref 135–145)

## 2021-02-28 LAB — LACTIC ACID, PLASMA: Lactic Acid, Venous: 0.6 mmol/L (ref 0.5–1.9)

## 2021-02-28 LAB — CBC
HCT: 36.3 % — ABNORMAL LOW (ref 39.0–52.0)
Hemoglobin: 12.4 g/dL — ABNORMAL LOW (ref 13.0–17.0)
MCH: 32.3 pg (ref 26.0–34.0)
MCHC: 34.2 g/dL (ref 30.0–36.0)
MCV: 94.5 fL (ref 80.0–100.0)
Platelets: 355 10*3/uL (ref 150–400)
RBC: 3.84 MIL/uL — ABNORMAL LOW (ref 4.22–5.81)
RDW: 13 % (ref 11.5–15.5)
WBC: 9.7 10*3/uL (ref 4.0–10.5)
nRBC: 0 % (ref 0.0–0.2)

## 2021-02-28 MED ORDER — CLINDAMYCIN PHOSPHATE 600 MG/50ML IV SOLN
600.0000 mg | Freq: Once | INTRAVENOUS | Status: AC
  Administered 2021-02-28: 600 mg via INTRAVENOUS
  Filled 2021-02-28: qty 50

## 2021-02-28 MED ORDER — CLINDAMYCIN HCL 300 MG PO CAPS: 300.0000 mg | ORAL_CAPSULE | Freq: Four times a day (QID) | ORAL | 0 refills | Status: AC

## 2021-02-28 MED ORDER — OXYCODONE-ACETAMINOPHEN 5-325 MG PO TABS
1.0000 | ORAL_TABLET | Freq: Once | ORAL | Status: AC
  Administered 2021-02-28: 1 via ORAL
  Filled 2021-02-28: qty 1

## 2021-02-28 MED ORDER — HYDROCODONE-ACETAMINOPHEN 5-325 MG PO TABS: 1.0000 | ORAL_TABLET | Freq: Four times a day (QID) | ORAL | 0 refills | Status: AC | PRN

## 2021-02-28 NOTE — ED Notes (Signed)
Patient is agitated, swearing at this writer, stating that he wanted the IV removed and the antibiotics stopped and to leave AMA. Patient states he is in pain and hasn't received any pain meds. Patient was advised some pain meds were ordered. Patient agreed to stay.

## 2021-02-28 NOTE — ED Notes (Signed)
Patient given crackers and graham crackers to take with his medication. LouAnn, Patient Advocate, at bedside.

## 2021-02-28 NOTE — ED Notes (Signed)
Patient ambulated to lobby with a steady gait. Patient states he is calling his sister to come get him. Patient did not want to wait in the room. Patient is alert and oriented, at times drowsy, but easily awakened. Patient has a working cell phone with him. Patient did not drive here.

## 2021-02-28 NOTE — Discharge Instructions (Addendum)
Please rest and elevate the left lower extremity.  Avoid excessive ambulation.  Take antibiotics as prescribed and return to the ER for any fevers increasing pain swelling warmth or redness of the left leg.

## 2021-02-28 NOTE — ED Triage Notes (Signed)
Pt to ED from home c/o left leg and arm pain 4 days ago.  Scabbed wounds noted to left leg with some redness noted around area.  States spots started as blisters that popped.  Denies fevers or known cause.  Pt unable to reach signature pad.  Understands MSE waiver.

## 2021-02-28 NOTE — ED Provider Notes (Signed)
[facility] EMERGENCY DEPARTMENT Provider Note   CSN: [id number] Arrival date & time: 02/28/21  1936     History Chief Complaint  Patient presents with   Leg Pain    Justin Hicks. is a 45 y.o. male presents to the emergency department evaluation of left leg wound.  Patient states 4 days ago Justin Hicks woke up with blisters along the left mid shin and proximal tibia region.  Over the last 4 days has had increased pain, swelling, redness with black eschar tissue present.  Justin Hicks has swelling throughout the left leg from the knee down to the ankle.  Pain is increased and is moderate.  Justin Hicks denies any fevers.  No history of blood clots.  Denies any trauma or injury.  HPI     History reviewed. No pertinent past medical history.  Patient Active Problem List   Diagnosis Date Noted   Major depressive disorder, recurrent severe without psychotic features (HCC) 04/24/2019   Polysubstance (including opioids) dependence, daily use (HCC) 04/24/2019    Past Surgical History:  Procedure Laterality Date   right leg surgery         Family History  Problem Relation Age of Onset   Cancer Neg Hx    Diabetes Neg Hx    Heart failure Neg Hx    Hyperlipidemia Neg Hx    Migraines Neg Hx    Hypertension Neg Hx     Social History   Tobacco Use   Smoking status: Every Day    Packs/day: 0.00    Pack years: 0.00    Types: Cigarettes   Smokeless tobacco: Never  Substance Use Topics   Alcohol use: Yes    Comment: occ   Drug use: No    Home Medications Prior to Admission medications   Medication Sig Start Date End Date Taking? Authorizing Provider  clindamycin (CLEOCIN) 300 MG capsule Take 1 capsule (300 mg total) by mouth 4 (four) times daily for 10 days. 02/28/21 03/10/21 Yes Evon Slack, PA-C  HYDROcodone-acetaminophen (NORCO) 5-325 MG tablet Take 1 tablet by mouth every 6 (six) hours as needed for moderate pain. 02/28/21  Yes Evon Slack, PA-C     Allergies    Patient has no known allergies.  Review of Systems   Review of Systems  Constitutional:  Negative for chills, fatigue and fever.  Respiratory:  Negative for cough, chest tightness and shortness of breath.   Cardiovascular:  Negative for chest pain.  Gastrointestinal:  Negative for nausea and vomiting.  Musculoskeletal:  Positive for gait problem and myalgias. Negative for joint swelling.  Skin:  Negative for rash and wound.  Neurological:  Negative for light-headedness and headaches.   Physical Exam Updated Vital Signs BP (!) 150/90 (BP Location: Left Arm)   Pulse 71   Temp 97.9 F (36.6 C) (Oral)   Resp 16   Ht 5\' 11"  (1.803 m)   Wt 68 kg   SpO2 98%   BMI 20.92 kg/m   Physical Exam Constitutional:      Appearance: Justin Hicks is well-developed.  HENT:     Head: Normocephalic and atraumatic.  Eyes:     Conjunctiva/sclera: Conjunctivae normal.  Cardiovascular:     Rate and Rhythm: Normal rate.  Pulmonary:     Effort: Pulmonary effort is normal. No respiratory distress.  Musculoskeletal:        General: Normal range of motion.     Cervical back: Normal range of motion.  Comments: Left lower extremity with circumferential swelling throughout the left leg from the knee down to the ankle.  There are 2 wounds on the left anterior lower leg, 1 along the proximal medial tibia and the other along the mid anterior tibia measuring 3 to 4 cm in diameter, flat and black with mild surrounding erythema around each wound.  There is no fluctuance or induration but tenderness is present.  There is no active drainage.  Justin Hicks has some pitting edema throughout the left leg along the distal tib-fib region.  Neuro vas intact in left foot.  Skin:    General: Skin is warm.     Findings: No rash.  Neurological:     Mental Status: Justin Hicks is alert and oriented to person, place, and time.  Psychiatric:        Behavior: Behavior normal.        Thought Content: Thought content normal.    ED  Results / Procedures / Treatments   Labs (all labs ordered are listed, but only abnormal results are displayed) Labs Reviewed  CBC - Abnormal; Notable for the following components:      Result Value   RBC 3.84 (*)    Hemoglobin 12.4 (*)    HCT 36.3 (*)    All other components within normal limits  BASIC METABOLIC PANEL - Abnormal; Notable for the following components:   Anion gap 4 (*)    All other components within normal limits  LACTIC ACID, PLASMA  LACTIC ACID, PLASMA    EKG None  Radiology DG Tibia/Fibula Left  Result Date: 02/28/2021 CLINICAL DATA:  Left leg pain and swelling. EXAM: LEFT TIBIA AND FIBULA - 2 VIEW COMPARISON:  None. FINDINGS: There is no evidence of fracture or other focal bone lesions. Mild soft tissue swelling. IMPRESSION: 1. No acute bone abnormality. 2. Mild diffuse soft tissue swelling. Electronically Signed   By: Signa Kell M.D.   On: 02/28/2021 20:31   US Venous Img Lower Unilateral Left  Result Date: 02/28/2021 CLINICAL DATA:  Left leg pain and swelling.  Edema. EXAM: LEFT LOWER EXTREMITY VENOUS DOPPLER ULTRASOUND TECHNIQUE: Gray-scale sonography with compression, as well as color and duplex ultrasound, were performed to evaluate the deep venous system(s) from the level of the common femoral vein through the popliteal and proximal calf veins. COMPARISON:  Radiograph earlier today FINDINGS: VENOUS Normal compressibility of the common femoral, superficial femoral, and popliteal veins, as well as the visualized calf veins. Visualized portions of profunda femoral vein and great saphenous vein unremarkable. No filling defects to suggest DVT on grayscale or color Doppler imaging. Doppler waveforms show normal direction of venous flow, normal respiratory plasticity and response to augmentation. Limited views of the contralateral common femoral vein are unremarkable. OTHER None. Limitations: none IMPRESSION: No evidence of left lower extremity DVT. Electronically  Signed   By: Narda Rutherford M.D.   On: 02/28/2021 20:50    Procedures Procedures   Medications Ordered in ED Medications  clindamycin (CLEOCIN) IVPB 600 mg (0 mg Intravenous Stopped 02/28/21 2148)  oxyCODONE-acetaminophen (PERCOCET/ROXICET) 5-325 MG per tablet 1 tablet (1 tablet Oral Given 02/28/21 2137)    ED Course  I have reviewed the triage vital signs and the nursing notes.  Pertinent labs & imaging results that were available during my care of the patient were reviewed by me and considered in my medical decision making (see chart for details).    MDM Rules/Calculators/A&P  45 year old male with 4-day history of wounds on the left lower leg.  Wounds initially began as blisters but then have turned into wounds with mild surrounding cellulitis.  Appears to have black eschar tissue along the central portion of both wounds with no fluctuance or induration.  Justin Hicks has swelling throughout the left leg.  X-ray showed no signs of fracture or osteomyelitis.  Ultrasound negative for DVT.  CBC, BMP lactic acid all within normal limits.  Justin Hicks is afebrile.  Justin Hicks is started on IV antibiotics and will be discharged home with oral clindamycin.  Justin Hicks understands signs and symptoms return to the ER for. Final Clinical Impression(s) / ED Diagnoses Final diagnoses:  Cellulitis of left lower extremity    Rx / DC Orders ED Discharge Orders          Ordered    HYDROcodone-acetaminophen (NORCO) 5-325 MG tablet  Every 6 hours PRN        02/28/21 2148    clindamycin (CLEOCIN) 300 MG capsule  4 times daily        02/28/21 2148             Ronnette Juniper 02/28/21 2150    Merwyn Katos, MD 02/28/21 [phone]
